# Patient Record
Sex: Male | Born: 1935 | Race: White | Hispanic: No | Marital: Married | Smoking: Former smoker
Health system: Southern US, Community
[De-identification: ages and names within clinical notes are randomized; demographics above are authoritative.]

## PROBLEM LIST (undated history)

## (undated) DIAGNOSIS — M2550 Pain in unspecified joint: Secondary | ICD-10-CM

## (undated) DIAGNOSIS — R350 Frequency of micturition: Secondary | ICD-10-CM

## (undated) DIAGNOSIS — Z5189 Encounter for other specified aftercare: Secondary | ICD-10-CM

## (undated) DIAGNOSIS — I10 Essential (primary) hypertension: Secondary | ICD-10-CM

## (undated) DIAGNOSIS — R3915 Urgency of urination: Secondary | ICD-10-CM

## (undated) DIAGNOSIS — Z860100 Personal history of colon polyps, unspecified: Secondary | ICD-10-CM

## (undated) DIAGNOSIS — M549 Dorsalgia, unspecified: Secondary | ICD-10-CM

## (undated) DIAGNOSIS — R351 Nocturia: Secondary | ICD-10-CM

## (undated) DIAGNOSIS — M1612 Unilateral primary osteoarthritis, left hip: Secondary | ICD-10-CM

## (undated) DIAGNOSIS — Z86718 Personal history of other venous thrombosis and embolism: Secondary | ICD-10-CM

## (undated) DIAGNOSIS — I2510 Atherosclerotic heart disease of native coronary artery without angina pectoris: Secondary | ICD-10-CM

## (undated) DIAGNOSIS — E785 Hyperlipidemia, unspecified: Secondary | ICD-10-CM

## (undated) DIAGNOSIS — M1990 Unspecified osteoarthritis, unspecified site: Secondary | ICD-10-CM

## (undated) DIAGNOSIS — K219 Gastro-esophageal reflux disease without esophagitis: Secondary | ICD-10-CM

## (undated) DIAGNOSIS — G4701 Insomnia due to medical condition: Secondary | ICD-10-CM

## (undated) HISTORY — PX: COLONOSCOPY: SHX174

## (undated) HISTORY — PX: HERNIA REPAIR: SHX51

## (undated) HISTORY — PX: CARDIAC DEFIBRILLATOR PLACEMENT: SHX171

## (undated) HISTORY — PX: VASECTOMY: SHX75

## (undated) HISTORY — PX: CARDIAC CATHETERIZATION: SHX172

## (undated) HISTORY — PX: PACEMAKER INSERTION: SHX728

## (undated) HISTORY — PX: TOTAL KNEE ARTHROPLASTY: SHX125

## (undated) HISTORY — PX: CHOLECYSTECTOMY: SHX55

## (undated) HISTORY — PX: TOTAL HIP ARTHROPLASTY: SHX124

## (undated) HISTORY — PX: OTHER SURGICAL HISTORY: SHX169

---

## 2003-07-25 ENCOUNTER — Emergency Department (HOSPITAL_COMMUNITY): Admission: EM | Admit: 2003-07-25 | Discharge: 2003-07-26 | Payer: Self-pay | Admitting: *Deleted

## 2003-07-27 ENCOUNTER — Ambulatory Visit (HOSPITAL_COMMUNITY): Admission: RE | Admit: 2003-07-27 | Discharge: 2003-07-27 | Payer: Self-pay | Admitting: Family Medicine

## 2006-05-29 DIAGNOSIS — IMO0001 Reserved for inherently not codable concepts without codable children: Secondary | ICD-10-CM

## 2006-05-29 DIAGNOSIS — Z5189 Encounter for other specified aftercare: Secondary | ICD-10-CM

## 2006-05-29 HISTORY — DX: Reserved for inherently not codable concepts without codable children: IMO0001

## 2006-05-29 HISTORY — DX: Encounter for other specified aftercare: Z51.89

## 2007-02-27 HISTORY — PX: CORONARY ARTERY BYPASS GRAFT: SHX141

## 2007-03-02 ENCOUNTER — Emergency Department (HOSPITAL_COMMUNITY): Admission: EM | Admit: 2007-03-02 | Discharge: 2007-03-02 | Payer: Self-pay | Admitting: Emergency Medicine

## 2007-10-19 ENCOUNTER — Emergency Department (HOSPITAL_COMMUNITY): Admission: EM | Admit: 2007-10-19 | Discharge: 2007-10-19 | Payer: Self-pay | Admitting: Emergency Medicine

## 2007-12-02 ENCOUNTER — Emergency Department (HOSPITAL_COMMUNITY): Admission: EM | Admit: 2007-12-02 | Discharge: 2007-12-02 | Payer: Self-pay | Admitting: Emergency Medicine

## 2008-10-14 ENCOUNTER — Inpatient Hospital Stay (HOSPITAL_COMMUNITY): Admission: EM | Admit: 2008-10-14 | Discharge: 2008-10-14 | Payer: Self-pay | Admitting: Emergency Medicine

## 2010-09-06 LAB — POCT CARDIAC MARKERS
CKMB, poc: <1 ng/mL — ABNORMAL LOW (ref 1.0–8.0)
Troponin i, poc: <0.05 ng/mL (ref 0.00–0.09)
Troponin i, poc: <0.05 ng/mL (ref 0.00–0.09)

## 2010-09-06 LAB — URINALYSIS, ROUTINE W REFLEX MICROSCOPIC
Bilirubin Urine: NEGATIVE
Hgb urine dipstick: NEGATIVE
Protein, ur: NEGATIVE mg/dL
Urobilinogen, UA: 0.2 mg/dL (ref 0.0–1.0)

## 2010-09-06 LAB — DIFFERENTIAL
Basophils Relative: 0 % (ref 0–1)
Eosinophils Absolute: 0 10*3/uL (ref 0.0–0.7)
Lymphs Abs: 0.8 10*3/uL (ref 0.7–4.0)
Neutrophils Relative %: 79 % — ABNORMAL HIGH (ref 43–77)

## 2010-09-06 LAB — CBC
HCT: 35.9 % — ABNORMAL LOW (ref 39.0–52.0)
Hemoglobin: 13 g/dL (ref 13.0–17.0)
MCHC: 36.1 g/dL — ABNORMAL HIGH (ref 30.0–36.0)
RBC: 3.94 MIL/uL — ABNORMAL LOW (ref 4.22–5.81)

## 2010-09-06 LAB — URINE CULTURE

## 2010-09-06 LAB — GLUCOSE, CAPILLARY
Glucose-Capillary: 102 mg/dL — ABNORMAL HIGH (ref 70–99)
Glucose-Capillary: 108 mg/dL — ABNORMAL HIGH (ref 70–99)

## 2010-09-06 LAB — COMPREHENSIVE METABOLIC PANEL
ALT: 14 U/L (ref 0–53)
Alkaline Phosphatase: 62 U/L (ref 39–117)
CO2: 29 mEq/L (ref 19–32)
Calcium: 9.5 mg/dL (ref 8.4–10.5)
GFR calc non Af Amer: >60 mL/min (ref >60)
Glucose, Bld: 186 mg/dL — ABNORMAL HIGH (ref 70–99)
Sodium: 136 mEq/L (ref 135–145)

## 2010-09-06 LAB — LIPASE, BLOOD: Lipase: 19 U/L (ref 11–59)

## 2010-09-06 LAB — PROTIME-INR: Prothrombin Time: 13.4 seconds (ref 11.6–15.2)

## 2010-10-11 NOTE — Discharge Summary (Signed)
NAMENASIRE, REALI NO.:  [id number]   MEDICAL RECORD NO.:  [id number]          PATIENT TYPE:  INP   LOCATION:  IC01                          FACILITY:  [facility]   PHYSICIAN:  Dalia Heading, M.D.  DATE OF BIRTH:  07-31-1931   DATE OF ADMISSION:  10/14/2008  DATE OF DISCHARGE:  05/19/2010LH                               DISCHARGE SUMMARY   HOSPITAL COURSE SUMMARY:  The patient is a 77 year old white male who  presented to the emergency room early this morning with right upper  quadrant abdominal pain.  CT scan of the pelvis was performed which  revealed acute cholecystitis.  He was admitted to the hospital for  further evaluation and treatment.  An ultrasound was done which  confirmed thickening gallbladder wall and sludge versus debris within  the gallbladder lumen.  The common bile duct was 5 mm in size.  There  was no evidence of pneumobilia or choledocholithiasis.  The patient has  been asymptomatic since his admission.  His diet was advanced without  difficulty.   The patient was offered a laparoscopic cholecystectomy here at had [facility], but he would prefer to the back to the [facility] where he has  had previous coronary bypass surgery.   The patient is being discharged home on Oct 14, 2008 with good and  improving condition.   DISCHARGE INSTRUCTIONS:  The patient is to follow up Dr. Franky Macho as  needed, he is to follow up with [facility] in [facility] for further  management and treatment of his cholecystitis.   DISCHARGE MEDICATIONS:  1. Ciprofloxacin 500 mg p.o. b.i.d. x1 week.  2. Aspirin 81 mg p.o. daily.  3. Amlodipine 10 mg p.o. daily.  4. Hydrochlorothiazide 25 mg p.o. daily.  5. Losartan 25 mg p.o. daily.  6. Multivitamin 1 tablet p.o. daily.  7. Omeprazole 20 mg p.o. daily.  8. Simvastatin 80 mg p.o. daily.  9. Diclofenac 75 mg p.o. b.i.d.  10.Terazosin 10 mg p.o. daily.  11.Stool softener nightly.  12.Nitrostat p.r.n. chest  pain.   PRINCIPAL DIAGNOSES:  1. Acute cholecystitis.  2. Hypertension.  3. Coronary artery disease.  4. History of non-insulin-dependent diabetes mellitus.   PRINCIPAL PROCEDURE:  None.       Dalia Heading, M.D.  Electronically Signed     MAJ/MEDQ  D:  10/14/2008  T:  10/15/2008  Job:  [id number]   cc:   Catalina Pizza, M.D.  Fax: [phone]

## 2010-10-11 NOTE — H&P (Signed)
NAMEOSSIEL, MARCHIO             ACCOUNT NO.:  [id number]   MEDICAL RECORD NO.:  [id number]          PATIENT TYPE:  INP   LOCATION:  IC01                          FACILITY:  [facility]   PHYSICIAN:  Dalia Heading, M.D.  DATE OF BIRTH:  [birth year]   DATE OF ADMISSION:  10/14/2008  DATE OF DISCHARGE:  05/19/2010LH                              HISTORY & PHYSICAL   CHIEF COMPLAINT:  Right upper quadrant abdominal pain.   HISTORY OF PRESENT ILLNESS:  The patient is a 77 year old white male who  is in his usual state of health where he began experiencing right upper  quadrant abdominal pain.  As it did not relent, he presented to the  emergency room for further evaluation and treatment.  A CT scan of the  abdomen and pelvis was performed which revealed acute cholecystitis with  borderline intrahepatic biliary dilatation versus pneumobilia.  A small  hiatal hernia was also noted.  A right lung base nodule was also noted.  His symptoms have somewhat resolved normal.   PAST MEDICAL HISTORY:  Coronary artery disease, hypertension, BPH.   PAST SURGICAL HISTORY:  A CABG in 2008 at the [facility] in  [place].   CURRENT MEDICATIONS:  1. Aspirin 81 mg p.o. daily.  2. Amlodipine 10 mg p.o. daily.  3. Hydrochlorothiazide 25 mg p.o. daily.  4. Losartan 25 mg p.o. daily.  5. Multivitamin 1 tablet p.o. daily.  6. Omeprazole 20 mg p.o. daily.  7. Simvastatin 80 mg p.o. daily.  8. Diclofenac 75 mg b.i.d.  9. Terazosin 10 mg p.o. daily.  10.Stool softener nightly.  11.Nitrostat 0.4 mg p.r.n.   ALLERGIES:  SULFA.   REVIEW OF SYSTEMS:  The patient is a nonsmoker.  He does drink totally.   PHYSICAL EXAMINATION:  GENERAL:  The patient is a well-developed, well-  nourished white male in no acute distress.  He is afebrile.  VITAL SIGNS:  Stable.  LUNGS:  Clear to auscultation with equal breath sounds bilaterally.  HEART:  Sinus bradycardia without S3, S4, murmurs.  ABDOMEN:  Soft, nontender,  nondistended.  No hepatosplenomegaly or  masses are noted.   LABORATORY FINDINGS:  MET-7 is within normal limits, white blood cell  count is 6.0, hemoglobin 13, hematocrit 35.9, platelet count 137.  Liver  profile was within normal limits.  Lipase is 19.  Urinalysis is  negative.  INR is 1.   IMPRESSION:  Biliary colic secondary to cholelithiasis.   PLAN:  Given the CT findings, an ultrasound of the gallbladder will be  ordered to further delineate the hepatobiliary tree.  He will be started  on Unasyn and pain medication.  Further management is pending of those  results.      Dalia Heading, M.D.  Electronically Signed     MAJ/MEDQ  D:  10/14/2008  T:  10/15/2008  Job:  [id number]   cc:   Catalina Pizza, M.D.  Fax: [phone]

## 2011-02-22 LAB — CBC
HCT: 32 — ABNORMAL LOW
MCHC: 35.6
Platelets: 151
RDW: 14.3

## 2011-02-22 LAB — DIFFERENTIAL
Basophils Absolute: 0
Basophils Relative: 0
Eosinophils Absolute: 0.1
Eosinophils Relative: 2
Lymphocytes Relative: 19

## 2011-02-22 LAB — BASIC METABOLIC PANEL
BUN: 20
GFR calc non Af Amer: >60
Glucose, Bld: 199 — ABNORMAL HIGH
Potassium: 3.6

## 2011-02-22 LAB — POCT CARDIAC MARKERS
Operator id: 106841
Troponin i, poc: <0.05

## 2011-03-09 LAB — POCT I-STAT CREATININE: Operator id: 267321

## 2011-03-09 LAB — POCT CARDIAC MARKERS
CKMB, poc: 1.8
Myoglobin, poc: 62.8
Operator id: 166561

## 2011-03-09 LAB — I-STAT 8, (EC8 V) (CONVERTED LAB)
BUN: 20
Chloride: 99
Glucose, Bld: 216 — ABNORMAL HIGH
pCO2, Ven: 37.5 — ABNORMAL LOW
pH, Ven: 7.374 — ABNORMAL HIGH

## 2011-05-29 ENCOUNTER — Encounter (HOSPITAL_COMMUNITY): Payer: Self-pay | Admitting: Pharmacy Technician

## 2011-06-05 ENCOUNTER — Other Ambulatory Visit: Payer: Self-pay | Admitting: Orthopedic Surgery

## 2011-06-06 ENCOUNTER — Encounter (HOSPITAL_COMMUNITY): Payer: Self-pay

## 2011-06-06 ENCOUNTER — Encounter (HOSPITAL_COMMUNITY)
Admission: RE | Admit: 2011-06-06 | Discharge: 2011-06-06 | Disposition: A | Discharge status: Home / Self Care | Payer: Medicare Other | Source: Ambulatory Visit | Attending: Orthopedic Surgery | Admitting: Orthopedic Surgery

## 2011-06-06 ENCOUNTER — Other Ambulatory Visit: Payer: Self-pay

## 2011-06-06 DIAGNOSIS — M169 Osteoarthritis of hip, unspecified: Secondary | ICD-10-CM | POA: Diagnosis not present

## 2011-06-06 DIAGNOSIS — E871 Hypo-osmolality and hyponatremia: Secondary | ICD-10-CM | POA: Diagnosis not present

## 2011-06-06 DIAGNOSIS — I10 Essential (primary) hypertension: Secondary | ICD-10-CM | POA: Diagnosis not present

## 2011-06-06 DIAGNOSIS — J449 Chronic obstructive pulmonary disease, unspecified: Secondary | ICD-10-CM | POA: Diagnosis not present

## 2011-06-06 DIAGNOSIS — D62 Acute posthemorrhagic anemia: Secondary | ICD-10-CM | POA: Diagnosis not present

## 2011-06-06 DIAGNOSIS — Z01811 Encounter for preprocedural respiratory examination: Secondary | ICD-10-CM | POA: Diagnosis not present

## 2011-06-06 HISTORY — DX: Pain in unspecified joint: M25.50

## 2011-06-06 HISTORY — DX: Personal history of colon polyps, unspecified: Z86.0100

## 2011-06-06 HISTORY — DX: Frequency of micturition: R35.0

## 2011-06-06 HISTORY — DX: Unspecified osteoarthritis, unspecified site: M19.90

## 2011-06-06 HISTORY — DX: Personal history of colonic polyps: Z86.010

## 2011-06-06 HISTORY — DX: Hyperlipidemia, unspecified: E78.5

## 2011-06-06 HISTORY — DX: Encounter for other specified aftercare: Z51.89

## 2011-06-06 HISTORY — DX: Nocturia: R35.1

## 2011-06-06 HISTORY — DX: Urgency of urination: R39.15

## 2011-06-06 HISTORY — DX: Essential (primary) hypertension: I10

## 2011-06-06 HISTORY — DX: Insomnia due to medical condition: G47.01

## 2011-06-06 HISTORY — DX: Personal history of other venous thrombosis and embolism: Z86.718

## 2011-06-06 HISTORY — DX: Atherosclerotic heart disease of native coronary artery without angina pectoris: I25.10

## 2011-06-06 HISTORY — DX: Dorsalgia, unspecified: M54.9

## 2011-06-06 HISTORY — DX: Gastro-esophageal reflux disease without esophagitis: K21.9

## 2011-06-06 LAB — PROTIME-INR
INR: 0.96 (ref 0.00–1.49)
Prothrombin Time: 13 seconds (ref 11.6–15.2)

## 2011-06-06 LAB — URINALYSIS, ROUTINE W REFLEX MICROSCOPIC
Bilirubin Urine: NEGATIVE
Glucose, UA: 500 mg/dL — AB
Ketones, ur: NEGATIVE mg/dL
Leukocytes, UA: NEGATIVE
Nitrite: NEGATIVE
Protein, ur: NEGATIVE mg/dL

## 2011-06-06 LAB — CBC
HCT: 32.9 % — ABNORMAL LOW (ref 39.0–52.0)
Hemoglobin: 11.8 g/dL — ABNORMAL LOW (ref 13.0–17.0)
WBC: 4.8 10*3/uL (ref 4.0–10.5)

## 2011-06-06 LAB — TYPE AND SCREEN: ABO/RH(D): A POS

## 2011-06-06 LAB — BASIC METABOLIC PANEL
Chloride: 95 mEq/L — ABNORMAL LOW (ref 96–112)
GFR calc Af Amer: 90 mL/min — ABNORMAL LOW (ref >90)
Potassium: 4 mEq/L (ref 3.5–5.1)
Sodium: 132 mEq/L — ABNORMAL LOW (ref 135–145)

## 2011-06-06 MED ORDER — CHLORHEXIDINE GLUCONATE 4 % EX LIQD: 60.0000 mL | Freq: Once | CUTANEOUS | Status: DC

## 2011-06-06 NOTE — Pre-Procedure Instructions (Signed)
20 Dustin Vincent  06/06/2011   Your procedure is scheduled on:  Tues,Jan 15 @ 0745  Report to [facility] at 0545 AM.  Call this number if you have problems the morning of surgery: [phone]   Remember:   Do not eat food:After Midnight.  May have clear liquids: up to 4 Hours before arrival.(until 1:45am)  Clear liquids include soda, tea, black coffee, apple or grape juice, broth.  Take these medicines the morning of surgery with A SIP OF WATER: Gabapentin,Pain Pill(if needed),Isosorbide,Metoprolol,Omeprazole   Do not wear jewelry, make-up or nail polish.  Do not wear lotions, powders, or perfumes. You may wear deodorant.  Do not shave 48 hours prior to surgery.  Do not bring valuables to the hospital.  Contacts, dentures or bridgework may not be worn into surgery.  Leave suitcase in the car. After surgery it may be brought to your room.  For patients admitted to the hospital, checkout time is 11:00 AM the day of discharge.   Patients discharged the day of surgery will not be allowed to drive home.  Name and phone number of your driver:   Special Instructions: CHG Shower Use Special Wash: 1/2 bottle night before surgery and 1/2 bottle morning of surgery.   Please read over the following fact sheets that you were given: Pain Booklet, Coughing and Deep Breathing, Blood Transfusion Information, Total Joint Packet, MRSA Information and Surgical Site Infection Prevention

## 2011-06-06 NOTE — Progress Notes (Signed)
Sees different cardiologists at [facility] in [place]--last visit just recent-to request records  Stress test done couple of years ago-to request records Echo done couple of years ago-to request records  Requested cardiac clearance from [facility]

## 2011-06-07 NOTE — Consult Note (Signed)
Anesthesia:  Patient is a 78 year old male scheduled for a left THA on 06/13/11.  His history includes HLD, HTN, CAD, CABG 2008, GERD, anemia, DM2, former smoker, trifasicular block, and history of "blood clots".  His is followed in the Cardiology Clinic at the [facility] in [place].  He was seen by Dr. Adella Hare on 05/03/11 for cardiac clearance.  His last cath there was on 07/27/10 with final diagnosis listed as 3V CAD, 2/3 grafts patent, 100% distal RCA CTO, occluded SVG to the distal native RCA, and failed attempt to balloon CTO of distal RCA.  Her note also mentions an echo done in May of 2011 showing preserved LVSF, mild LVH, grade 1 diastolic dysfunction.  She also mentions a recent exercise stress test with achievement of 7.6 METS.  I have requested these records from the [place].   EKG shows SB with first degree AVB, right BBB, LAFB, bifascicular block, LVH with QRS widening.  Probably nonspecific T wave changes.   CXR shows COPD without acute cardiopulmonary disease.    Labs acceptable form an Anesthesia standpoint.  He will get a CBG when he arrives for surgery.  His H/H is 11.8/32.9.  A T&S has already been done.  Since he has received Cardiac clearance, I anticipate that he can proceed as scheduled if he remains asymptomatic.

## 2011-06-12 MED ORDER — CEFAZOLIN SODIUM-DEXTROSE 2-3 GM-% IV SOLR
2.0000 g | INTRAVENOUS | Status: AC
  Administered 2011-06-13: 2 g via INTRAVENOUS
  Filled 2011-06-12: qty 50

## 2011-06-13 ENCOUNTER — Encounter (HOSPITAL_COMMUNITY): Payer: Self-pay | Admitting: *Deleted

## 2011-06-13 ENCOUNTER — Ambulatory Visit (HOSPITAL_COMMUNITY): Payer: Medicare Other

## 2011-06-13 ENCOUNTER — Encounter (HOSPITAL_COMMUNITY): Payer: Self-pay | Admitting: Orthopedic Surgery

## 2011-06-13 ENCOUNTER — Encounter (HOSPITAL_COMMUNITY)
Admission: RE | Disposition: A | Discharge status: Skilled Nursing Facility | Payer: Self-pay | Source: Ambulatory Visit | Attending: Orthopedic Surgery

## 2011-06-13 ENCOUNTER — Encounter (HOSPITAL_COMMUNITY): Payer: Self-pay | Admitting: Vascular Surgery

## 2011-06-13 ENCOUNTER — Ambulatory Visit (HOSPITAL_COMMUNITY): Payer: Medicare Other | Admitting: Vascular Surgery

## 2011-06-13 ENCOUNTER — Inpatient Hospital Stay (HOSPITAL_COMMUNITY)
Admission: RE | Admit: 2011-06-13 | Discharge: 2011-06-16 | DRG: 470 | Disposition: A | Discharge status: Skilled Nursing Facility | Payer: Medicare Other | Source: Ambulatory Visit | Attending: Orthopedic Surgery | Admitting: Orthopedic Surgery

## 2011-06-13 DIAGNOSIS — M169 Osteoarthritis of hip, unspecified: Secondary | ICD-10-CM | POA: Diagnosis not present

## 2011-06-13 DIAGNOSIS — Z951 Presence of aortocoronary bypass graft: Secondary | ICD-10-CM

## 2011-06-13 DIAGNOSIS — I10 Essential (primary) hypertension: Secondary | ICD-10-CM | POA: Diagnosis not present

## 2011-06-13 DIAGNOSIS — Z86718 Personal history of other venous thrombosis and embolism: Secondary | ICD-10-CM

## 2011-06-13 DIAGNOSIS — I2510 Atherosclerotic heart disease of native coronary artery without angina pectoris: Secondary | ICD-10-CM | POA: Diagnosis present

## 2011-06-13 DIAGNOSIS — Z860100 Personal history of colon polyps, unspecified: Secondary | ICD-10-CM

## 2011-06-13 DIAGNOSIS — Z01812 Encounter for preprocedural laboratory examination: Secondary | ICD-10-CM | POA: Diagnosis not present

## 2011-06-13 DIAGNOSIS — E785 Hyperlipidemia, unspecified: Secondary | ICD-10-CM | POA: Diagnosis present

## 2011-06-13 DIAGNOSIS — M549 Dorsalgia, unspecified: Secondary | ICD-10-CM | POA: Diagnosis present

## 2011-06-13 DIAGNOSIS — K5900 Constipation, unspecified: Secondary | ICD-10-CM | POA: Diagnosis present

## 2011-06-13 DIAGNOSIS — R509 Fever, unspecified: Secondary | ICD-10-CM | POA: Diagnosis not present

## 2011-06-13 DIAGNOSIS — Z96649 Presence of unspecified artificial hip joint: Secondary | ICD-10-CM

## 2011-06-13 DIAGNOSIS — Z471 Aftercare following joint replacement surgery: Secondary | ICD-10-CM | POA: Diagnosis not present

## 2011-06-13 DIAGNOSIS — E119 Type 2 diabetes mellitus without complications: Secondary | ICD-10-CM | POA: Diagnosis present

## 2011-06-13 DIAGNOSIS — K219 Gastro-esophageal reflux disease without esophagitis: Secondary | ICD-10-CM | POA: Diagnosis present

## 2011-06-13 DIAGNOSIS — E860 Dehydration: Secondary | ICD-10-CM | POA: Diagnosis not present

## 2011-06-13 DIAGNOSIS — M1612 Unilateral primary osteoarthritis, left hip: Secondary | ICD-10-CM

## 2011-06-13 DIAGNOSIS — Z882 Allergy status to sulfonamides status: Secondary | ICD-10-CM | POA: Diagnosis not present

## 2011-06-13 DIAGNOSIS — M25559 Pain in unspecified hip: Secondary | ICD-10-CM | POA: Diagnosis not present

## 2011-06-13 DIAGNOSIS — J449 Chronic obstructive pulmonary disease, unspecified: Secondary | ICD-10-CM | POA: Diagnosis not present

## 2011-06-13 DIAGNOSIS — Z87891 Personal history of nicotine dependence: Secondary | ICD-10-CM

## 2011-06-13 DIAGNOSIS — E871 Hypo-osmolality and hyponatremia: Secondary | ICD-10-CM | POA: Diagnosis not present

## 2011-06-13 DIAGNOSIS — D62 Acute posthemorrhagic anemia: Secondary | ICD-10-CM | POA: Diagnosis not present

## 2011-06-13 DIAGNOSIS — M1610 Unilateral primary osteoarthritis, unspecified hip: Principal | ICD-10-CM | POA: Diagnosis present

## 2011-06-13 DIAGNOSIS — Z96659 Presence of unspecified artificial knee joint: Secondary | ICD-10-CM

## 2011-06-13 DIAGNOSIS — M1710 Unilateral primary osteoarthritis, unspecified knee: Secondary | ICD-10-CM | POA: Diagnosis not present

## 2011-06-13 DIAGNOSIS — I517 Cardiomegaly: Secondary | ICD-10-CM | POA: Diagnosis not present

## 2011-06-13 DIAGNOSIS — M1990 Unspecified osteoarthritis, unspecified site: Secondary | ICD-10-CM | POA: Diagnosis not present

## 2011-06-13 HISTORY — DX: Unilateral primary osteoarthritis, left hip: M16.12

## 2011-06-13 HISTORY — PX: TOTAL HIP ARTHROPLASTY: SHX124

## 2011-06-13 LAB — GLUCOSE, CAPILLARY: Glucose-Capillary: 169 mg/dL — ABNORMAL HIGH (ref 70–99)

## 2011-06-13 SURGERY — ARTHROPLASTY, HIP, TOTAL,POSTERIOR APPROACH
Anesthesia: General | Site: Hip | Laterality: Left | Wound class: Clean

## 2011-06-13 MED ORDER — METHOCARBAMOL 500 MG PO TABS: 500.0000 mg | ORAL_TABLET | Freq: Four times a day (QID) | ORAL | Status: DC

## 2011-06-13 MED ORDER — GLYCOPYRROLATE 0.2 MG/ML IJ SOLN
INTRAMUSCULAR | Status: DC | PRN
  Administered 2011-06-13: .4 mg via INTRAVENOUS

## 2011-06-13 MED ORDER — GABAPENTIN 100 MG PO CAPS
200.0000 mg | ORAL_CAPSULE | Freq: Every day | ORAL | Status: DC
  Administered 2011-06-14: 200 mg via ORAL
  Filled 2011-06-13 (×3): qty 2

## 2011-06-13 MED ORDER — ISOSORBIDE MONONITRATE ER 30 MG PO TB24
30.0000 mg | ORAL_TABLET | Freq: Every day | ORAL | Status: DC
  Administered 2011-06-14 – 2011-06-16 (×2): 30 mg via ORAL
  Filled 2011-06-13 (×5): qty 1

## 2011-06-13 MED ORDER — METOPROLOL TARTRATE 25 MG PO TABS
25.0000 mg | ORAL_TABLET | Freq: Two times a day (BID) | ORAL | Status: DC
  Administered 2011-06-14 – 2011-06-16 (×5): 25 mg via ORAL
  Filled 2011-06-13 (×7): qty 1

## 2011-06-13 MED ORDER — WARFARIN VIDEO
Freq: Once | Status: AC
  Administered 2011-06-14: 12:00:00

## 2011-06-13 MED ORDER — ZOLPIDEM TARTRATE 5 MG PO TABS: 5.0000 mg | ORAL_TABLET | Freq: Every evening | ORAL | Status: DC | PRN

## 2011-06-13 MED ORDER — LIDOCAINE HCL (PF) 1 % IJ SOLN
INTRAMUSCULAR | Status: DC | PRN
  Administered 2011-06-13: 3 mL via INTRADERMAL

## 2011-06-13 MED ORDER — ENOXAPARIN SODIUM 40 MG/0.4ML ~~LOC~~ SOLN
40.0000 mg | Freq: Every day | SUBCUTANEOUS | Status: DC
  Administered 2011-06-13 – 2011-06-15 (×3): 40 mg via SUBCUTANEOUS
  Filled 2011-06-13 (×4): qty 0.4

## 2011-06-13 MED ORDER — METOCLOPRAMIDE HCL 10 MG PO TABS: 5.0000 mg | ORAL_TABLET | Freq: Three times a day (TID) | ORAL | Status: DC | PRN

## 2011-06-13 MED ORDER — SODIUM CHLORIDE 0.9 % IR SOLN
Status: DC | PRN
  Administered 2011-06-13: 1000 mL

## 2011-06-13 MED ORDER — ENOXAPARIN SODIUM 40 MG/0.4ML ~~LOC~~ SOLN: 40.0000 mg | SUBCUTANEOUS | Status: DC

## 2011-06-13 MED ORDER — METOCLOPRAMIDE HCL 5 MG/ML IJ SOLN
5.0000 mg | Freq: Three times a day (TID) | INTRAMUSCULAR | Status: DC | PRN
  Filled 2011-06-13: qty 2

## 2011-06-13 MED ORDER — METHOCARBAMOL 100 MG/ML IJ SOLN
500.0000 mg | INTRAVENOUS | Status: DC
  Filled 2011-06-13: qty 5

## 2011-06-13 MED ORDER — ACETAMINOPHEN 325 MG PO TABS
650.0000 mg | ORAL_TABLET | Freq: Four times a day (QID) | ORAL | Status: DC | PRN
  Administered 2011-06-15: 650 mg via ORAL
  Filled 2011-06-13: qty 2

## 2011-06-13 MED ORDER — MIDAZOLAM HCL 5 MG/5ML IJ SOLN
INTRAMUSCULAR | Status: DC | PRN
  Administered 2011-06-13: 1 mg via INTRAVENOUS

## 2011-06-13 MED ORDER — EPHEDRINE SULFATE 50 MG/ML IJ SOLN
INTRAMUSCULAR | Status: DC | PRN
  Administered 2011-06-13 (×2): 5 mg via INTRAVENOUS

## 2011-06-13 MED ORDER — NEOSTIGMINE METHYLSULFATE 1 MG/ML IJ SOLN
INTRAMUSCULAR | Status: DC | PRN
  Administered 2011-06-13: 3 mg via INTRAVENOUS

## 2011-06-13 MED ORDER — SIMVASTATIN 20 MG PO TABS
20.0000 mg | ORAL_TABLET | Freq: Every day | ORAL | Status: DC
  Administered 2011-06-13 – 2011-06-15 (×3): 20 mg via ORAL
  Filled 2011-06-13 (×4): qty 1

## 2011-06-13 MED ORDER — METFORMIN HCL 500 MG PO TABS
1000.0000 mg | ORAL_TABLET | Freq: Two times a day (BID) | ORAL | Status: DC
  Administered 2011-06-14 – 2011-06-16 (×5): 1000 mg via ORAL
  Filled 2011-06-13 (×7): qty 2

## 2011-06-13 MED ORDER — PROMETHAZINE HCL 25 MG/ML IJ SOLN: 6.2500 mg | INTRAMUSCULAR | Status: DC | PRN

## 2011-06-13 MED ORDER — ONDANSETRON HCL 4 MG PO TABS: 4.0000 mg | ORAL_TABLET | Freq: Four times a day (QID) | ORAL | Status: DC | PRN

## 2011-06-13 MED ORDER — SCOPOLAMINE 1 MG/3DAYS TD PT72
MEDICATED_PATCH | TRANSDERMAL | Status: DC | PRN
  Administered 2011-06-13: 1 via TRANSDERMAL

## 2011-06-13 MED ORDER — WARFARIN SODIUM 7.5 MG PO TABS
7.5000 mg | ORAL_TABLET | Freq: Once | ORAL | Status: AC
  Administered 2011-06-13: 7.5 mg via ORAL
  Filled 2011-06-13: qty 1

## 2011-06-13 MED ORDER — OXYCODONE-ACETAMINOPHEN 10-325 MG PO TABS: 1.0000 | ORAL_TABLET | Freq: Four times a day (QID) | ORAL | Status: AC | PRN

## 2011-06-13 MED ORDER — ACETAMINOPHEN 650 MG RE SUPP: 650.0000 mg | Freq: Four times a day (QID) | RECTAL | Status: DC | PRN

## 2011-06-13 MED ORDER — FENTANYL CITRATE 0.05 MG/ML IJ SOLN
INTRAMUSCULAR | Status: DC | PRN
  Administered 2011-06-13 (×3): 50 ug via INTRAVENOUS
  Administered 2011-06-13: 100 ug via INTRAVENOUS
  Administered 2011-06-13 (×3): 50 ug via INTRAVENOUS

## 2011-06-13 MED ORDER — PHENOL 1.4 % MT LIQD
1.0000 | OROMUCOSAL | Status: DC | PRN
  Filled 2011-06-13: qty 177

## 2011-06-13 MED ORDER — CEFAZOLIN SODIUM 1-5 GM-% IV SOLN
1.0000 g | Freq: Four times a day (QID) | INTRAVENOUS | Status: AC
  Administered 2011-06-13 – 2011-06-14 (×3): 1 g via INTRAVENOUS
  Filled 2011-06-13 (×3): qty 50

## 2011-06-13 MED ORDER — METHYLPREDNISOLONE ACETATE PF 40 MG/ML IJ SUSP
INTRAMUSCULAR | Status: DC | PRN
  Administered 2011-06-13: 40 mg

## 2011-06-13 MED ORDER — ONDANSETRON HCL 4 MG/2ML IJ SOLN
INTRAMUSCULAR | Status: DC | PRN
  Administered 2011-06-13: 4 mg via INTRAVENOUS

## 2011-06-13 MED ORDER — MEPERIDINE HCL 25 MG/ML IJ SOLN: 6.2500 mg | INTRAMUSCULAR | Status: DC | PRN

## 2011-06-13 MED ORDER — POTASSIUM CHLORIDE IN NACL 20-0.45 MEQ/L-% IV SOLN
INTRAVENOUS | Status: DC
  Administered 2011-06-13: 16:00:00 via INTRAVENOUS
  Filled 2011-06-13 (×7): qty 1000

## 2011-06-13 MED ORDER — DIPHENHYDRAMINE HCL 12.5 MG/5ML PO ELIX
12.5000 mg | ORAL_SOLUTION | ORAL | Status: DC | PRN
  Filled 2011-06-13: qty 10

## 2011-06-13 MED ORDER — PROPOFOL 10 MG/ML IV EMUL
INTRAVENOUS | Status: DC | PRN
  Administered 2011-06-13: 100 mg via INTRAVENOUS

## 2011-06-13 MED ORDER — PANTOPRAZOLE SODIUM 40 MG PO TBEC
40.0000 mg | DELAYED_RELEASE_TABLET | Freq: Every day | ORAL | Status: DC
  Administered 2011-06-14 – 2011-06-16 (×3): 40 mg via ORAL
  Filled 2011-06-13 (×4): qty 1

## 2011-06-13 MED ORDER — OXYCODONE HCL 5 MG PO TABS
5.0000 mg | ORAL_TABLET | ORAL | Status: DC | PRN
  Administered 2011-06-14 – 2011-06-16 (×5): 10 mg via ORAL
  Filled 2011-06-13 (×5): qty 2

## 2011-06-13 MED ORDER — TERAZOSIN HCL 5 MG PO CAPS
10.0000 mg | ORAL_CAPSULE | Freq: Every day | ORAL | Status: DC
  Administered 2011-06-13 – 2011-06-14 (×2): 10 mg via ORAL
  Filled 2011-06-13 (×7): qty 2

## 2011-06-13 MED ORDER — OXYCODONE-ACETAMINOPHEN 5-325 MG PO TABS
1.0000 | ORAL_TABLET | ORAL | Status: DC | PRN
  Administered 2011-06-13 – 2011-06-16 (×6): 2 via ORAL
  Administered 2011-06-16 (×2): 1 via ORAL
  Filled 2011-06-13 (×2): qty 2
  Filled 2011-06-13: qty 1
  Filled 2011-06-13 (×5): qty 2
  Filled 2011-06-13: qty 1

## 2011-06-13 MED ORDER — METHOCARBAMOL 500 MG PO TABS
500.0000 mg | ORAL_TABLET | Freq: Four times a day (QID) | ORAL | Status: DC | PRN
  Administered 2011-06-13 – 2011-06-16 (×6): 500 mg via ORAL
  Filled 2011-06-13 (×6): qty 1

## 2011-06-13 MED ORDER — COUMADIN BOOK
Freq: Once | Status: AC
  Administered 2011-06-13: 18:00:00
  Filled 2011-06-13: qty 1

## 2011-06-13 MED ORDER — LACTATED RINGERS IV SOLN
INTRAVENOUS | Status: DC | PRN
  Administered 2011-06-13 (×3): via INTRAVENOUS

## 2011-06-13 MED ORDER — LOSARTAN POTASSIUM 50 MG PO TABS
100.0000 mg | ORAL_TABLET | Freq: Every day | ORAL | Status: DC
  Administered 2011-06-13 – 2011-06-16 (×3): 100 mg via ORAL
  Filled 2011-06-13 (×6): qty 2

## 2011-06-13 MED ORDER — HYDROCHLOROTHIAZIDE 25 MG PO TABS
25.0000 mg | ORAL_TABLET | Freq: Every day | ORAL | Status: DC
  Administered 2011-06-13 – 2011-06-16 (×3): 25 mg via ORAL
  Filled 2011-06-13 (×5): qty 1

## 2011-06-13 MED ORDER — METHOCARBAMOL 100 MG/ML IJ SOLN
500.0000 mg | Freq: Four times a day (QID) | INTRAVENOUS | Status: DC | PRN
  Administered 2011-06-13: 500 mg via INTRAVENOUS
  Filled 2011-06-13: qty 5

## 2011-06-13 MED ORDER — HYDROMORPHONE HCL PF 1 MG/ML IJ SOLN
0.2500 mg | INTRAMUSCULAR | Status: DC | PRN
  Administered 2011-06-13 (×2): 0.5 mg via INTRAVENOUS

## 2011-06-13 MED ORDER — ONDANSETRON HCL 4 MG/2ML IJ SOLN
4.0000 mg | Freq: Four times a day (QID) | INTRAMUSCULAR | Status: DC | PRN
  Administered 2011-06-16: 4 mg via INTRAVENOUS
  Filled 2011-06-13: qty 2

## 2011-06-13 MED ORDER — ACETAMINOPHEN 10 MG/ML IV SOLN
INTRAVENOUS | Status: AC
  Filled 2011-06-13: qty 100

## 2011-06-13 MED ORDER — HYDROMORPHONE HCL PF 1 MG/ML IJ SOLN
0.5000 mg | INTRAMUSCULAR | Status: DC | PRN
  Administered 2011-06-13 – 2011-06-16 (×7): 1 mg via INTRAVENOUS
  Filled 2011-06-13 (×6): qty 1

## 2011-06-13 MED ORDER — BUPIVACAINE HCL (PF) 0.25 % IJ SOLN
INTRAMUSCULAR | Status: DC | PRN
  Administered 2011-06-13: 20 mL

## 2011-06-13 MED ORDER — MENTHOL 3 MG MT LOZG: 1.0000 | LOZENGE | OROMUCOSAL | Status: DC | PRN

## 2011-06-13 MED ORDER — ROCURONIUM BROMIDE 100 MG/10ML IV SOLN
INTRAVENOUS | Status: DC | PRN
  Administered 2011-06-13: 50 mg via INTRAVENOUS

## 2011-06-13 MED ORDER — HYDROMORPHONE HCL PF 1 MG/ML IJ SOLN
INTRAMUSCULAR | Status: AC
  Filled 2011-06-13: qty 1

## 2011-06-13 MED ORDER — WARFARIN SODIUM 5 MG PO TABS: 5.0000 mg | ORAL_TABLET | Freq: Every day | ORAL | Status: DC

## 2011-06-13 MED ORDER — ACETAMINOPHEN 10 MG/ML IV SOLN
INTRAVENOUS | Status: DC | PRN
  Administered 2011-06-13: 1000 mg via INTRAVENOUS

## 2011-06-13 SURGICAL SUPPLY — 66 items
BENZOIN TINCTURE PRP APPL 2/3 (GAUZE/BANDAGES/DRESSINGS) ×2 IMPLANT
BLADE SAW SAG 73X25 THK (BLADE) ×1
BLADE SAW SGTL 73X25 THK (BLADE) ×1 IMPLANT
BLADE SURG 10 STRL SS (BLADE) ×2 IMPLANT
BRUSH FEMORAL CANAL (MISCELLANEOUS) IMPLANT
CLOTH BEACON ORANGE TIMEOUT ST (SAFETY) ×2 IMPLANT
COVER BACK TABLE 24X17X13 BIG (DRAPES) ×2 IMPLANT
COVER SURGICAL LIGHT HANDLE (MISCELLANEOUS) ×4 IMPLANT
DRAPE INCISE IOBAN 66X45 STRL (DRAPES) ×2 IMPLANT
DRAPE ORTHO SPLIT 77X108 STRL (DRAPES) ×2
DRAPE PROXIMA HALF (DRAPES) ×2 IMPLANT
DRAPE SURG ORHT 6 SPLT 77X108 (DRAPES) ×2 IMPLANT
DRAPE U-SHAPE 47X51 STRL (DRAPES) ×2 IMPLANT
DRILL BIT 5/64 (BIT) ×2 IMPLANT
DRSG MEPILEX BORDER 4X12 (GAUZE/BANDAGES/DRESSINGS) ×2 IMPLANT
DRSG MEPILEX BORDER 4X8 (GAUZE/BANDAGES/DRESSINGS) IMPLANT
DRSG PAD ABDOMINAL 8X10 ST (GAUZE/BANDAGES/DRESSINGS) ×4 IMPLANT
DURAPREP 26ML APPLICATOR (WOUND CARE) ×2 IMPLANT
ELECT CAUTERY BLADE 6.4 (BLADE) ×4 IMPLANT
ELECT REM PT RETURN 9FT ADLT (ELECTROSURGICAL) ×2
ELECTRODE REM PT RTRN 9FT ADLT (ELECTROSURGICAL) ×1 IMPLANT
EVACUATOR 1/8 PVC DRAIN (DRAIN) IMPLANT
GLOVE BIOGEL PI IND STRL 8 (GLOVE) ×1 IMPLANT
GLOVE BIOGEL PI INDICATOR 8 (GLOVE) ×1
GLOVE ECLIPSE 7.0 STRL STRAW (GLOVE) ×2 IMPLANT
GLOVE ORTHO TXT STRL SZ7.5 (GLOVE) ×4 IMPLANT
GLOVE SURG ORTHO 8.0 STRL STRW (GLOVE) ×2 IMPLANT
GOWN STRL NON-REIN LRG LVL3 (GOWN DISPOSABLE) ×6 IMPLANT
HANDPIECE INTERPULSE COAX TIP (DISPOSABLE)
HOOD PEEL AWAY FACE SHEILD DIS (HOOD) ×4 IMPLANT
KIT BASIN OR (CUSTOM PROCEDURE TRAY) ×2 IMPLANT
KIT ROOM TURNOVER OR (KITS) ×2 IMPLANT
MANIFOLD NEPTUNE II (INSTRUMENTS) ×2 IMPLANT
NEEDLE 18GX1X1/2 (RX/OR ONLY) (NEEDLE) ×2 IMPLANT
NEEDLE HYPO 25GX1X1/2 BEV (NEEDLE) ×6 IMPLANT
NS IRRIG 1000ML POUR BTL (IV SOLUTION) ×2 IMPLANT
PACK TOTAL JOINT (CUSTOM PROCEDURE TRAY) ×2 IMPLANT
PAD ARMBOARD 7.5X6 YLW CONV (MISCELLANEOUS) ×4 IMPLANT
PENCIL BUTTON HOLSTER BLD 10FT (ELECTRODE) ×2 IMPLANT
PILLOW ABDUCTION HIP (SOFTGOODS) ×2 IMPLANT
PRESSURIZER FEMORAL UNIV (MISCELLANEOUS) IMPLANT
RETRIEVER SUT HEWSON (MISCELLANEOUS) ×2 IMPLANT
SET HNDPC FAN SPRY TIP SCT (DISPOSABLE) IMPLANT
SPONGE GAUZE 4X4 12PLY (GAUZE/BANDAGES/DRESSINGS) IMPLANT
SPONGE LAP 4X18 X RAY DECT (DISPOSABLE) IMPLANT
STRIP CLOSURE SKIN 1/2X4 (GAUZE/BANDAGES/DRESSINGS) IMPLANT
STRIP CLOSURE SKIN 1X5 (GAUZE/BANDAGES/DRESSINGS) ×2 IMPLANT
SUCTION FRAZIER TIP 10 FR DISP (SUCTIONS) ×2 IMPLANT
SUT FIBERWIRE #2 38 REV NDL BL (SUTURE) ×6
SUT FIBERWIRE #2 38 T-5 BLUE (SUTURE)
SUT MNCRL AB 3-0 PS2 18 (SUTURE) ×2 IMPLANT
SUT VIC AB 0 CT1 27 (SUTURE) ×1
SUT VIC AB 0 CT1 27XBRD ANBCTR (SUTURE) ×1 IMPLANT
SUT VIC AB 1 CT1 27 (SUTURE) ×1
SUT VIC AB 1 CT1 27XBRD ANBCTR (SUTURE) ×1 IMPLANT
SUT VIC AB 2-0 CT1 27 (SUTURE) ×1
SUT VIC AB 2-0 CT1 TAPERPNT 27 (SUTURE) ×1 IMPLANT
SUT VIC AB 3-0 SH 8-18 (SUTURE) ×2 IMPLANT
SUTURE FIBERWR #2 38 T-5 BLUE (SUTURE) IMPLANT
SUTURE FIBERWR#2 38 REV NDL BL (SUTURE) ×3 IMPLANT
SYR CONTROL 10ML LL (SYRINGE) ×6 IMPLANT
TOWEL OR 17X24 6PK STRL BLUE (TOWEL DISPOSABLE) ×2 IMPLANT
TOWEL OR 17X26 10 PK STRL BLUE (TOWEL DISPOSABLE) ×2 IMPLANT
TOWER CARTRIDGE SMART MIX (DISPOSABLE) IMPLANT
TRAY FOLEY CATH 14FR (SET/KITS/TRAYS/PACK) ×2 IMPLANT
WATER STERILE IRR 1000ML POUR (IV SOLUTION) ×6 IMPLANT

## 2011-06-13 NOTE — Transfer of Care (Signed)
Immediate Anesthesia Transfer of Care Note  Patient: Dustin Vincent  Procedure(s) Performed:  TOTAL HIP ARTHROPLASTY - 2 HOURS NEEDED FOR THIS CASE/ Left Knee Injection PER Kathy  Patient Location: PACU  Anesthesia Type: General  Level of Consciousness: awake, alert  and oriented  Airway & Oxygen Therapy: Patient Spontanous Breathing and Patient connected to nasal cannula oxygen  Post-op Assessment: Report given to PACU RN, Post -op Vital signs reviewed and stable and Patient moving all extremities X 4  Post vital signs: Reviewed and stable Filed Vitals:   06/13/11 0624  BP: 133/73  Pulse: 59  Temp: 36.8 C  Resp: 18    Complications: No apparent anesthesia complications

## 2011-06-13 NOTE — Progress Notes (Signed)
ANTICOAGULATION CONSULT NOTE - Initial Consult  Pharmacy Consult for Coumadin Indication: VTE prophylaxis  Allergies  Allergen Reactions  . Sulfa Antibiotics Rash    Was a salve    Patient Measurements: Height: 5\' 11"  (180.3 cm) Weight: 189 lb (85.73 kg) (from medical records 05/04/11) IBW/kg (Calculated) : 75.3    Vital Signs: Temp: 97.3 F (36.3 C) (01/15 1305) Temp src: Oral (01/15 1305) BP: 133/68 mmHg (01/15 1305) Pulse Rate: 57  (01/15 1305)  Labs: Preop labs 06/06/11 WBC 4.8, H/H 11.8/32.9, PLTC 153, PT 13, INR 0.96,  No results found for this basename: HGB:2,HCT:3,PLT:3,APTT:3,LABPROT:3,INR:3,HEPARINUNFRC:3,CREATININE:3,CKTOTAL:3,CKMB:3,TROPONINI:3 in the last 72 hours Estimated Creatinine Clearance: 67.9 ml/min (by C-G formula based on Cr of 0.97).  Medical History: Past Medical History  Diagnosis Date  . Hyperlipidemia     takes Zocor daily  . Hypertension     Losartan,Isosorbide,and Metoprolol daily  . Coronary artery disease   . History of blood clots     pt states a filter was placed  . Arthritis   . Joint pain   . Back pain     3 buldging disc  . Constipation     takes a stool softener daily  . History of colon polyps   . Urinary frequency   . Urinary urgency   . Nocturia   . GERD (gastroesophageal reflux disease)     takes Omeprazole daily  . Blood transfusion 2008  . Diabetes mellitus     takes Metformin 1000mg  bid  . Insomnia due to medical condition   . Primary osteoarthritis of left hip 06/13/2011  s/p 3vCABG in 2008, Diastolic Heart failure , TTE 09/2009 preserved LVSF  Medications:  Prescriptions prior to admission  Medication Sig Dispense Refill  . hydrochlorothiazide (HYDRODIURIL) 25 MG tablet Take 25 mg by mouth daily.        . isosorbide mononitrate (IMDUR) 30 MG 24 hr tablet Take 30 mg by mouth daily.        Marland Kitchen losartan (COZAAR) 100 MG tablet Take 100 mg by mouth daily.        . metFORMIN (GLUCOPHAGE) 500 MG tablet Take 1,000 mg by  mouth 2 (two) times daily with a meal.        . metoprolol tartrate (LOPRESSOR) 25 MG tablet Take 25 mg by mouth 2 (two) times daily.        Marland Kitchen omeprazole (PRILOSEC) 20 MG capsule Take 20 mg by mouth 2 (two) times daily.        . simvastatin (ZOCOR) 40 MG tablet Take 20 mg by mouth at bedtime.        Marland Kitchen terazosin (HYTRIN) 10 MG capsule Take 10 mg by mouth at bedtime.        Marland Kitchen DISCONTD: HYDROcodone-acetaminophen (VICODIN) 5-500 MG per tablet Take 1 tablet by mouth every 6 (six) hours as needed. For pain       . DISCONTD: meloxicam (MOBIC) 15 MG tablet Take 15 mg by mouth daily.        Marland Kitchen gabapentin (NEURONTIN) 100 MG capsule Take 200 mg by mouth daily.         Scheduled:    . ceFAZolin (ANCEF) IV  1 g Intravenous Q6H  . ceFAZolin (ANCEF) IV  2 g Intravenous 60 min Pre-Op  . enoxaparin  40 mg Subcutaneous QHS  . gabapentin  200 mg Oral Daily  . hydrochlorothiazide  25 mg Oral Daily  . HYDROmorphone      . isosorbide mononitrate  30 mg Oral  Daily  . losartan  100 mg Oral Daily  . metFORMIN  1,000 mg Oral BID WC  . metoprolol tartrate  25 mg Oral BID  . pantoprazole  40 mg Oral Q1200  . simvastatin  20 mg Oral QHS  . terazosin  10 mg Oral QHS  . DISCONTD: methocarbamol(ROBAXIN) IV  500 mg Intravenous To PACU    Assessment: 78 yo WM s/p left THA due to osteoarthritis. PMH indicates h/o blood clot and had filter placed. Patient's memory of this is not clear. No h/o recent bleeding complications. Lovenox 40mg  sq q24hr bridge until INR =or >1.8.   Goal of Therapy:  INR 2-3   Plan:  Coumadin 7.5mg  po tonight.  Lovenox to be discontinued when INR =/> 1.8 Monitor daily INR.   Arman Filter 06/13/2011,3:47 PM

## 2011-06-13 NOTE — Anesthesia Postprocedure Evaluation (Signed)
  Anesthesia Post-op Note  Patient: Dustin Vincent  Procedure(s) Performed:  TOTAL HIP ARTHROPLASTY - 2 HOURS NEEDED FOR THIS CASE/ Left Knee Injection PER Kathy  Patient Location: PACU  Anesthesia Type: General  Level of Consciousness: awake and alert   Airway and Oxygen Therapy: Patient Spontanous Breathing and Patient connected to nasal cannula oxygen  Post-op Pain: mild  Post-op Assessment: Post-op Vital signs reviewed, Patient's Cardiovascular Status Stable, Respiratory Function Stable, Patent Airway and No signs of Nausea or vomiting  Post-op Vital Signs: Reviewed and stable  Complications: No apparent anesthesia complications

## 2011-06-13 NOTE — Op Note (Signed)
06/13/2011  9:44 AM  PATIENT:  Dustin Vincent   MRN: [id number]  PRE-OPERATIVE DIAGNOSIS:  DJD LEFT HIP/osteoarthritis  POST-OPERATIVE DIAGNOSIS:  DJD LEFT HIP/osteoarthritis  PROCEDURE:  Procedure(s): TOTAL HIP ARTHROPLASTY  PREOPERATIVE INDICATIONS:    Dustin Vincent is an 78 y.o. male who has a diagnosis of Primary osteoarthritis of left hip and elected for surgical management after failing conservative treatment, including anti-inflammatories, pain medication, activity modification, exercises, and assisted devices, among others..  The risks benefits and alternatives were discussed with the patient including but not limited to the risks of nonoperative treatment, versus surgical intervention including infection, bleeding, nerve injury, periprosthetic fracture, the need for revision surgery, dislocation, leg length discrepancy, blood clots, cardiopulmonary complications, morbidity, mortality, among others, and they were willing to proceed.  Predicted outcome is good, although there will be at least a six to nine month expected recovery.    OPERATIVE REPORT     SURGEON:  Teryl Lucy, MD    ASSISTANT:  Janace Litten, OPA-C  (Present throughout the entire procedure,  necessary for completion of procedure in a timely manner, assisting with retraction, instrumentation, and closure)     ANESTHESIA:  General    COMPLICATIONS:  None.     COMPONENTS:  [employer] fit femur size 7 high offset with a 36 mm +5 mm head ball and a pinnacle acetabular shell size 54 millimeter with a 10 degree lipped polyethylene liner    PROCEDURE IN DETAIL:   The patient was met in the holding area and  identified.  The appropriate hip was identified and marked at the operative site.  The patient was then transported to the OR  and  placed under general l anesthesia.  At that point, the patient was  placed in the lateral decubitus position with the operative side up and  secured to the operating  room table and all bony prominences padded.     The operative lower extremity was prepped from the iliac crest to the distal leg.  Sterile draping was performed.  Time out was performed prior to incision.      A routine posterolateral approach was utilized via sharp dissection  carried down to the subcutaneous tissue.  Gross bleeders were Bovie coagulated.  The iliotibial band was identified and incised along the length of the skin incision.  Self-retaining retractors were  inserted.  With the hip internally rotated, the short external rotators  were identified. The piriformis and capsule was tagged with FiberWire, and the hip capsule released in a T-type fashion.  The femoral neck was exposed, and I resected the femoral neck using the appropriate jig. This was performed at approximately a thumb's breadth above the lesser trochanter.    I then exposed the deep acetabulum, cleared out any tissue including the ligamentum teres.  A wing retractor was placed.  After adequate visualization, I excised the labrum, and then sequentially reamed.  I placed the trial acetabulum, which seated nicely, and then impacted the real cup into place.  Appropriate version and inclination was confirmed clinically matching their bony anatomy, and also with the use of the jig.  A trial polyethylene liner was placed and the wing retractor removed.    I then prepared the proximal femur using the cookie-cutter, the lateralizing reamer, and then sequentially reamed and broached.  A trial broach, neck, and head was utilized, and I reduced the hip and it was found to have excellent stability with functional range of motion. The trial components  were then removed, and the real polyethylene liner was placed with the lip directed posteriorly.  I then impacted the real femoral prosthesis into place into the appropriate version, slightly anteverted to the normal anatomy, and I impacted the real head ball into place. The hip was then  reduced and taken through functional range of motion and found to have excellent stability. Leg lengths were restored. The high offset had better stability, and restored the anatomy more accurately, so this was selected.  I then used a 2 mm drill bits to pass the FiberWire suture from the capsule and puriform is through the greater trochanter, and secured this. Excellent posterior capsular repair was achieved. I also closed the T in the capsule.  I then irrigated the hip copiously again with pulse lavage, and repaired the fascia with Vicryl, followed by Vicryl for the subcutaneous tissue, Monocryl for the skin, Steri-Strips and sterile gauze. The wounds were injected. The patient was then awakened and returned to PACU in stable and satisfactory condition. No complications.  Teryl Lucy, MD Orthopedic Surgeon [phone]   06/13/2011 9:44 AM

## 2011-06-13 NOTE — Anesthesia Preprocedure Evaluation (Addendum)
Anesthesia Evaluation  Patient identified by MRN, date of birth, ID band Patient awake    Reviewed: Allergy & Precautions, H&P , NPO status , Patient's Chart, lab work & pertinent test results, reviewed documented beta blocker date and time   History of Anesthesia Complications (+) PONV  Airway Mallampati: II TM Distance: <3 FB Neck ROM: Full    Dental No notable dental hx. (+) Teeth Intact and Dental Advisory Given   Pulmonary neg pulmonary ROS,  clear to auscultation  Pulmonary exam normal       Cardiovascular hypertension, Pt. on medications and Pt. on home beta blockers + CAD and neg cardio ROS regular Normal    Neuro/Psych Negative Neurological ROS  Negative Psych ROS   GI/Hepatic negative GI ROS, Neg liver ROS, GERD-  Controlled and Medicated,  Endo/Other  Negative Endocrine ROSDiabetes mellitus-, Type 2  Renal/GU negative Renal ROS  Genitourinary negative   Musculoskeletal   Abdominal   Peds  Hematology negative hematology ROS (+)   Anesthesia Other Findings   Reproductive/Obstetrics negative OB ROS                         Anesthesia Physical Anesthesia Plan  ASA: III  Anesthesia Plan: General   Post-op Pain Management:    Induction: Intravenous  Airway Management Planned: Oral ETT  Additional Equipment:   Intra-op Plan:   Post-operative Plan: Extubation in OR  Informed Consent: I have reviewed the patients History and Physical, chart, labs and discussed the procedure including the risks, benefits and alternatives for the proposed anesthesia with the patient or authorized representative who has indicated his/her understanding and acceptance.     Plan Discussed with: CRNA  Anesthesia Plan Comments:         Anesthesia Quick Evaluation

## 2011-06-13 NOTE — Plan of Care (Signed)
Problem: Consults Goal: Diagnosis- Total Joint Replacement Outcome: Progressing Primary Total Hip Left
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
## 2011-06-14 LAB — PROTIME-INR: Prothrombin Time: 15.4 seconds — ABNORMAL HIGH (ref 11.6–15.2)

## 2011-06-14 LAB — CBC
HCT: 28 % — ABNORMAL LOW (ref 39.0–52.0)
Hemoglobin: 9.9 g/dL — ABNORMAL LOW (ref 13.0–17.0)
MCH: 30.8 pg (ref 26.0–34.0)
MCHC: 35.4 g/dL (ref 30.0–36.0)
MCV: 87.2 fL (ref 78.0–100.0)
RBC: 3.21 MIL/uL — ABNORMAL LOW (ref 4.22–5.81)

## 2011-06-14 LAB — BASIC METABOLIC PANEL
BUN: 13 mg/dL (ref 6–23)
CO2: 26 mEq/L (ref 19–32)
Calcium: 8.8 mg/dL (ref 8.4–10.5)
Creatinine, Ser: 0.94 mg/dL (ref 0.50–1.35)
GFR calc non Af Amer: 79 mL/min — ABNORMAL LOW (ref >90)
Glucose, Bld: 217 mg/dL — ABNORMAL HIGH (ref 70–99)

## 2011-06-14 MED ORDER — WARFARIN SODIUM 6 MG PO TABS
6.0000 mg | ORAL_TABLET | Freq: Once | ORAL | Status: AC
  Administered 2011-06-14: 6 mg via ORAL
  Filled 2011-06-14: qty 1

## 2011-06-14 NOTE — Progress Notes (Signed)
Physical Therapy Treatment Patient Details Name: Dustin Vincent MRN: [id number] DOB: 1931/07/24 Today's Date: 06/14/2011  PT Assessment/Plan  PT - Assessment/Plan Comments on Treatment Session: Pt admitted for left THA and is very motivated to progress.  Pt tolerated ambulation bid as well as left LE ther ex.  Co-treatment with OT. PT Plan: Discharge plan remains appropriate;Frequency remains appropriate PT Frequency: 7X/week Follow Up Recommendations: Home health PT;Supervision - Intermittent Equipment Recommended: None recommended by PT PT Goals  Acute Rehab PT Goals PT Goal Formulation: With patient Time For Goal Achievement: 7 days PT Goal: Sit to Stand - Progress: Progressing toward goal PT Goal: Stand to Sit - Progress: Progressing toward goal PT Goal: Ambulate - Progress: Progressing toward goal PT Goal: Perform Home Exercise Program - Progress: Progressing toward goal  PT Treatment Precautions/Restrictions  Precautions Precautions: Posterior Hip Precaution Booklet Issued: No Required Braces or Orthoses: No Restrictions Weight Bearing Restrictions: Yes LLE Weight Bearing: Weight bearing as tolerated Other Position/Activity Restrictions: Abduction Pillow in bed. Pain 2/10 in left hip.  Pt repositioned after treatment. Mobility (including Balance) Bed Mobility Bed Mobility: No Supine to Sit: Not tested (comment) Transfers Transfers: Yes Sit to Stand: 1: +2 Total assist;Patient percentage (comment);With upper extremity assist;From chair/3-in-1 ((pt=60%)) Sit to Stand Details (indicate cue type and reason): Assist to translate trunk anterior over BOS.  Cues for hand and left LE placement. Stand to Sit: 1: +2 Total assist;Patient percentage (comment);With upper extremity assist;To chair/3-in-1 ((pt=50%)) Stand to Sit Details: Assist to control descent to chair while maintaining posterior hips precautions.  Cues for hand and left LE placement for  safety. Ambulation/Gait Ambulation/Gait: Yes Ambulation/Gait Assistance: 4: Min assist Ambulation/Gait Assistance Details (indicate cue type and reason): Assist for balance and constant verbal cues for sequence with RW. Ambulation Distance (Feet): 45 Feet Assistive device: Rolling walker Gait Pattern: Step-to pattern;Decreased step length - left;Trunk flexed Stairs: No Wheelchair Mobility Wheelchair Mobility: No  Posture/Postural Control Posture/Postural Control: No significant limitations Balance Balance Assessed: No Exercise  Total Joint Exercises Ankle Circles/Pumps: AROM;Left;10 reps;Supine Quad Sets: AROM;Left;10 reps;Supine Heel Slides: AAROM;Left;10 reps;Supine Long Arc Quad: AAROM;Left;10 reps;Seated End of Session PT - End of Session Equipment Utilized During Treatment: Gait belt Activity Tolerance: Patient tolerated treatment well Patient left: in chair;with call bell in reach Nurse Communication: Mobility status for transfers;Mobility status for ambulation General Behavior During Session: [facility] for tasks performed Cognition: [facility] for tasks performed  Cephus Shelling 06/14/2011, 3:06 PM  06/14/2011 Cephus Shelling, PT, DPT [phone]

## 2011-06-14 NOTE — Progress Notes (Signed)
Subjective: 1 Day Post-Op Procedure(s) (LRB): TOTAL HIP ARTHROPLASTY (Left) Patient reports pain as 6 on 0-10 scale and 7 on 0-10 scale.   Complains of shooting pain down into his foot which starts at his hip. Objective: Vital signs in last 24 hours: Temp:  [97.3 F (36.3 C)-98.5 F (36.9 C)] 97.8 F (36.6 C) (01/16 0657) Pulse Rate:  [50-90] 90  (01/16 0657) Resp:  [8-25] 18  (01/16 0657) BP: (121-157)/(61-82) 157/82 mmHg (01/16 0657) SpO2:  [95 %-100 %] 95 % (01/16 0657) Weight:  [85.73 kg (189 lb)] 85.73 kg (189 lb) (01/15 1500)  Intake/Output from previous day: 01/15 0701 - 01/16 0700 In: 3665 [I.V.:3615; IV Piggyback:50] Out: 2425 [Urine:2275; Blood:150] Intake/Output this shift:     Basename 06/14/11 0605  HGB 9.9*    Basename 06/14/11 0605  WBC 6.6  RBC 3.21*  HCT 28.0*  PLT 133*    Basename 06/14/11 0605  NA 127*  K 4.2  CL 93*  CO2 26  BUN 13  CREATININE 0.94  GLUCOSE 217*  CALCIUM 8.8    Basename 06/14/11 0605  LABPT --  INR 1.19    Sensation intact distally Intact pulses distally Dorsiflexion/Plantar flexion intact Incision: dressing C/D/I and no drainage No CP or SOB Assessment/Plan: 1 Day Post-Op Procedure(s) (LRB): TOTAL HIP ARTHROPLASTY (Left) Advance diet Up with therapy DC Foley per protocol  Transition to PO pain meds ABLA He also has hyponatremia, which seems to be asymptomatic , but we will institute fluid restriction. We will continue to monitor this. Torrie Mayers, BRANDON 06/14/2011, 8:09 AM

## 2011-06-14 NOTE — Progress Notes (Signed)
ANTICOAGULATION CONSULT NOTE -Follow up   Pharmacy Consult for Coumadin Indication: VTE prophylaxis  Allergies  Allergen Reactions  . Sulfa Antibiotics Rash    Was a salve    Patient Measurements: Height: 5\' 11"  (180.3 cm) Weight: 189 lb (85.73 kg) (from medical records 05/04/11) IBW/kg (Calculated) : 75.3    Vital Signs: Temp: 97.8 F (36.6 C) (01/16 0657) BP: 157/82 mmHg (01/16 0657) Pulse Rate: 90  (01/16 0657)  Labs: Preop labs 06/06/11 WBC 4.8, H/H 11.8/32.9, PLTC 153, PT 13, INR 0.96,   Basename 06/14/11 0605  HGB 9.9*  HCT 28.0*  PLT 133*  APTT --  LABPROT 15.4*  INR 1.19  HEPARINUNFRC --  CREATININE 0.94  CKTOTAL --  CKMB --  TROPONINI --   Estimated Creatinine Clearance: 70.1 ml/min (by C-G formula based on Cr of 0.94).  Medical History: Past Medical History  Diagnosis Date  . Hyperlipidemia     takes Zocor daily  . Hypertension     Losartan,Isosorbide,and Metoprolol daily  . Coronary artery disease   . History of blood clots     pt states a filter was placed  . Arthritis   . Joint pain   . Back pain     3 buldging disc  . Constipation     takes a stool softener daily  . History of colon polyps   . Urinary frequency   . Urinary urgency   . Nocturia   . GERD (gastroesophageal reflux disease)     takes Omeprazole daily  . Blood transfusion 2008  . Diabetes mellitus     takes Metformin 1000mg  bid  . Insomnia due to medical condition   . Primary osteoarthritis of left hip 06/13/2011  s/p 3vCABG in 2008, Diastolic Heart failure , TTE 09/2009 preserved LVSF  Medications:  Prescriptions prior to admission  Medication Sig Dispense Refill  . hydrochlorothiazide (HYDRODIURIL) 25 MG tablet Take 25 mg by mouth daily.        . isosorbide mononitrate (IMDUR) 30 MG 24 hr tablet Take 30 mg by mouth daily.        Marland Kitchen losartan (COZAAR) 100 MG tablet Take 100 mg by mouth daily.        . metFORMIN (GLUCOPHAGE) 500 MG tablet Take 1,000 mg by mouth 2 (two)  times daily with a meal.        . metoprolol tartrate (LOPRESSOR) 25 MG tablet Take 25 mg by mouth 2 (two) times daily.        Marland Kitchen omeprazole (PRILOSEC) 20 MG capsule Take 20 mg by mouth 2 (two) times daily.        . simvastatin (ZOCOR) 40 MG tablet Take 20 mg by mouth at bedtime.        Marland Kitchen terazosin (HYTRIN) 10 MG capsule Take 10 mg by mouth at bedtime.        Marland Kitchen DISCONTD: HYDROcodone-acetaminophen (VICODIN) 5-500 MG per tablet Take 1 tablet by mouth every 6 (six) hours as needed. For pain       . DISCONTD: meloxicam (MOBIC) 15 MG tablet Take 15 mg by mouth daily.        Marland Kitchen gabapentin (NEURONTIN) 100 MG capsule Take 200 mg by mouth daily.         Scheduled:     . ceFAZolin (ANCEF) IV  1 g Intravenous Q6H  . coumadin book   Does not apply Once  . enoxaparin  40 mg Subcutaneous QHS  . gabapentin  200 mg Oral Daily  .  hydrochlorothiazide  25 mg Oral Daily  . HYDROmorphone      . isosorbide mononitrate  30 mg Oral Daily  . losartan  100 mg Oral Daily  . metFORMIN  1,000 mg Oral BID WC  . metoprolol tartrate  25 mg Oral BID  . pantoprazole  40 mg Oral Q1200  . simvastatin  20 mg Oral QHS  . terazosin  10 mg Oral QHS  . warfarin  7.5 mg Oral ONCE-1800  . warfarin   Does not apply Once    Assessment: 78 yo WM s/p left THA due to osteoarthritis.  Lovenox 40mg  sq q24hr bridge until INR =or >1.8. INR today 1.19.  H/H 9.9/28, pltc 133K. No bleeding reported.  Goal of Therapy:  INR 2-3   Plan:  Coumadin 6 mg po tonight.  Lovenox to be discontinued when INR =/> 1.8 Monitor daily INR.   Arman Filter 06/14/2011,1:25 PM
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
## 2011-06-14 NOTE — Progress Notes (Signed)
Inpatient Diabetes Program Recommendations  AACE/ADA: New Consensus Statement on Inpatient Glycemic Control (2009)  Target Ranges:  Prepandial:   less than 140 mg/dL      Peak postprandial:   less than 180 mg/dL (1-2 hours)      Critically ill patients:  140 - 180 mg/dL   Reason for Visit: Note history of diabetes.  Lab glucose this morning was 217 mg/dL.  Please order CBG's tid with meals and at bedtime.  Inpatient Diabetes Program Recommendations Correction (SSI): Please add Novolog correction moderate tid with meals. HgbA1C: Please order to assess prehospitalization glycemic control  Note: Also, change diet to CHO modified-medium.

## 2011-06-14 NOTE — Progress Notes (Signed)
UR COMPLETED  

## 2011-06-14 NOTE — Clinical Documentation Improvement (Signed)
GENERIC DOCUMENTATION CLARIFICATION QUERY  THIS DOCUMENT IS NOT A PERMANENT PART OF THE MEDICAL RECORD  TO RESPOND TO THE THIS QUERY, FOLLOW THE INSTRUCTIONS BELOW:  1. If needed, update documentation for the patient's encounter via the notes activity.  2. Access this query again and click edit on the In [employer].  3. After updating, or not, click F2 to complete all highlighted (required) fields concerning your review. Select "additional documentation in the medical record" OR "no additional documentation provided".  4. Click Sign note button.  5. The deficiency will fall out of your In Basket *Please let us know if you are not able to complete this workflow by phone or e-mail (listed below).  Please update your documentation within the medical record to reflect your response to this query.                                                                                        06/14/11   Dear [facility],  In a better effort to capture your patient's severity of illness, reflect appropriate length of stay and utilization of resources, a review of the patient medical record has revealed the following indicators.    Based on your clinical judgment, please clarify and document in a progress note and/or discharge summary the clinical condition associated with the following supporting information:   Possible Clinical Conditions?   _______Hyponatremia _______Other Condition _______Cannot Clinically Determine   Supporting Information:  Lab Results: 1/16: sodium: 127  Treatment:  1/15: 0.45% NaCl w/79meq kcl @ 75cc/hr  You may use possible, probable, or suspect with inpatient documentation. possible, probable, suspected diagnoses MUST be documented at the time of discharge  Reviewed: additional documentation in the medical record  Thank You,  Marciano Sequin,  Clinical Documentation Specialist:  Pager: [phone]  Health Information Management [facility]
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
## 2011-06-15 ENCOUNTER — Encounter (HOSPITAL_COMMUNITY): Payer: Self-pay | Admitting: Orthopedic Surgery

## 2011-06-15 ENCOUNTER — Inpatient Hospital Stay (HOSPITAL_COMMUNITY): Payer: Medicare Other

## 2011-06-15 LAB — BASIC METABOLIC PANEL
BUN: 18 mg/dL (ref 6–23)
Creatinine, Ser: 1.01 mg/dL (ref 0.50–1.35)
GFR calc Af Amer: 81 mL/min — ABNORMAL LOW (ref >90)
GFR calc non Af Amer: 70 mL/min — ABNORMAL LOW (ref >90)
Glucose, Bld: 167 mg/dL — ABNORMAL HIGH (ref 70–99)
Potassium: 4.2 mEq/L (ref 3.5–5.1)

## 2011-06-15 LAB — CBC
HCT: 24.1 % — ABNORMAL LOW (ref 39.0–52.0)
Hemoglobin: 8.4 g/dL — ABNORMAL LOW (ref 13.0–17.0)
MCHC: 34.9 g/dL (ref 30.0–36.0)
MCV: 88 fL (ref 78.0–100.0)
RDW: 12.8 % (ref 11.5–15.5)

## 2011-06-15 LAB — URINE CULTURE: Culture: NO GROWTH

## 2011-06-15 LAB — GLUCOSE, CAPILLARY
Glucose-Capillary: 189 mg/dL — ABNORMAL HIGH (ref 70–99)
Glucose-Capillary: 182 mg/dL — ABNORMAL HIGH (ref 70–99)

## 2011-06-15 MED ORDER — WARFARIN SODIUM 6 MG PO TABS
6.0000 mg | ORAL_TABLET | Freq: Once | ORAL | Status: AC
  Administered 2011-06-15: 6 mg via ORAL
  Filled 2011-06-15: qty 1

## 2011-06-15 MED ORDER — BISACODYL 10 MG RE SUPP
10.0000 mg | Freq: Every day | RECTAL | Status: DC | PRN
  Administered 2011-06-16: 10 mg via RECTAL
  Filled 2011-06-15: qty 1

## 2011-06-15 MED ORDER — SENNOSIDES-DOCUSATE SODIUM 8.6-50 MG PO TABS
1.0000 | ORAL_TABLET | Freq: Every evening | ORAL | Status: DC | PRN
  Administered 2011-06-15: 1 via ORAL
  Filled 2011-06-15: qty 1

## 2011-06-15 MED ORDER — DOCUSATE SODIUM 100 MG PO CAPS
100.0000 mg | ORAL_CAPSULE | Freq: Two times a day (BID) | ORAL | Status: DC
  Administered 2011-06-15 – 2011-06-16 (×3): 100 mg via ORAL
  Filled 2011-06-15 (×3): qty 1

## 2011-06-15 MED ORDER — INSULIN ASPART 100 UNIT/ML ~~LOC~~ SOLN
0.0000 [IU] | Freq: Three times a day (TID) | SUBCUTANEOUS | Status: DC
  Administered 2011-06-15 – 2011-06-16 (×4): 3 [IU] via SUBCUTANEOUS
  Filled 2011-06-15: qty 3

## 2011-06-15 NOTE — Progress Notes (Signed)
Physical Therapy Treatment Patient Details Name: Dustin Vincent MRN: [id number] DOB: [birth year] Today's Date: 06/15/2011  PT Assessment/Plan  PT - Assessment/Plan Comments on Treatment Session: Pt admitted for left THA and is very motivated to progress.  Pt tolerated ambulation this am.  Discussed d/c plans with MD due to no assistance at home.  Pt would benefit from STSNF. PT Plan: Discharge plan needs to be updated;Frequency remains appropriate PT Frequency: 7X/week Follow Up Recommendations: Skilled nursing facility Equipment Recommended: Defer to next venue PT Goals  Acute Rehab PT Goals PT Goal Formulation: With patient Time For Goal Achievement: 7 days PT Goal: Supine/Side to Sit - Progress: Progressing toward goal PT Goal: Sit to Stand - Progress: Progressing toward goal PT Goal: Stand to Sit - Progress: Progressing toward goal PT Goal: Ambulate - Progress: Progressing toward goal PT Goal: Perform Home Exercise Program - Progress: Progressing toward goal  PT Treatment Precautions/Restrictions  Precautions Precautions: Posterior Hip Precaution Booklet Issued: No Precaution Comments: Pt able to recall 2/3 posterior hip precautions.  Verbal cues for 3/3 precautions. Required Braces or Orthoses: No Restrictions Weight Bearing Restrictions: Yes LLE Weight Bearing: Weight bearing as tolerated Other Position/Activity Restrictions: Abduction Pillow in bed. Pain 3/10 in left hip.  Pt repositioned. Mobility (including Balance) Bed Mobility Bed Mobility: Yes Supine to Sit: 3: Mod assist Supine to Sit Details (indicate cue type and reason): Assist for left LE and trunk due to pain with cues for sequence and to maintain posterior hip precautions on left LE. Transfers Transfers: Yes Sit to Stand: 1: +2 Total assist;Patient percentage (comment) ((pt=60%)) Sit to Stand Details (indicate cue type and reason): Assist for trunk to translate anterior over BOS with cues for safest  hand and left LE placement. Stand to Sit: 4: Min assist;With upper extremity assist;To chair/3-in-1 Stand to Sit Details: Assist to slow descent to chair with cues for hand and left LE placement.  Assist to maintain posterior hip precautions. Ambulation/Gait Ambulation/Gait: Yes Ambulation/Gait Assistance: 4: Min assist Ambulation/Gait Assistance Details (indicate cue type and reason): Assist for balance with cues for sequence inside RW.  Pt able to progress to min (guard). Ambulation Distance (Feet): 50 Feet Assistive device: Rolling walker Gait Pattern: Step-to pattern;Decreased step length - left;Trunk flexed Stairs: No Wheelchair Mobility Wheelchair Mobility: No  Posture/Postural Control Posture/Postural Control: No significant limitations Balance Balance Assessed: No Exercise  Total Joint Exercises Ankle Circles/Pumps: AROM;Left;10 reps;Supine Quad Sets: AROM;Left;10 reps;Supine Heel Slides: AAROM;Left;10 reps;Supine End of Session PT - End of Session Equipment Utilized During Treatment: Gait belt Activity Tolerance: Patient tolerated treatment well Patient left: in chair;with call bell in reach Nurse Communication: Mobility status for transfers;Mobility status for ambulation General Behavior During Session: [facility] for tasks performed Cognition: [facility] for tasks performed  Cephus Shelling 06/15/2011, 9:34 AM  06/15/2011 Cephus Shelling, PT, DPT [phone]

## 2011-06-15 NOTE — Discharge Summary (Signed)
Physician Discharge Summary  Patient ID: Dustin Vincent MRN: [id number] DOB/AGE: 03-Mar-1932 78 y.o.  Admit date: 06/13/2011 Discharge date: 06/16/2011  Admission Diagnoses:  Primary osteoarthritis of left hip  Discharge Diagnoses:  Principal Problem:  *Primary osteoarthritis of left hip Hyponatremia Acute blood loss anemia  Past Medical History  Diagnosis Date  . Hyperlipidemia     takes Zocor daily  . Hypertension     Losartan,Isosorbide,and Metoprolol daily  . Coronary artery disease   . History of blood clots     pt states a filter was placed  . Arthritis   . Joint pain   . Back pain     3 buldging disc  . Constipation     takes a stool softener daily  . History of colon polyps   . Urinary frequency   . Urinary urgency   . Nocturia   . GERD (gastroesophageal reflux disease)     takes Omeprazole daily  . Blood transfusion 2008  . Diabetes mellitus     takes Metformin 1000mg  bid  . Insomnia due to medical condition   . Primary osteoarthritis of left hip 06/13/2011    Surgeries: Procedure(s): TOTAL HIP ARTHROPLASTY on 06/13/2011   Consultants (if any):    Discharged Condition: Improved  Hospital Course: Dustin Vincent is an 78 y.o. male who was admitted 06/13/2011 with a diagnosis of Primary osteoarthritis of left hip and went to the operating room on 06/13/2011 and underwent the above named procedures.    He was given perioperative antibiotics:  Anti-infectives     Start     Dose/Rate Route Frequency Ordered Stop   06/13/11 1500   ceFAZolin (ANCEF) IVPB 1 g/50 mL premix        1 g 100 mL/hr over 30 Minutes Intravenous Every 6 hours 06/13/11 1329 06/14/11 0254   06/12/11 1530   ceFAZolin (ANCEF) IVPB 2 g/50 mL premix        2 g 100 mL/hr over 30 Minutes Intravenous 60 min pre-op 06/12/11 1517 06/13/11 0809        .  He was given sequential compression devices, early ambulation, and chemoprophylaxis for DVT prophylaxis.  He benefited maximally  from their hospital stay and there were no complications.    Recent vital signs:  Filed Vitals:   06/15/11 0500  BP: 133/87  Pulse: 86  Temp: 97.9 F (36.6 C)  Resp: 20    Recent laboratory studies:  Lab Results  Component Value Date   HGB 8.4* 06/15/2011   HGB 9.9* 06/14/2011   HGB 11.8* 06/06/2011   Lab Results  Component Value Date   WBC 6.3 06/15/2011   PLT 120* 06/15/2011   Lab Results  Component Value Date   INR 1.39 06/15/2011   Lab Results  Component Value Date   NA 127* 06/15/2011   K 4.2 06/15/2011   CL 92* 06/15/2011   CO2 27 06/15/2011   BUN 18 06/15/2011   CREATININE 1.01 06/15/2011   GLUCOSE 167* 06/15/2011    Discharge Medications:   Current Discharge Medication List    START taking these medications   Details  enoxaparin (LOVENOX) 40 MG/0.4ML SOLN Inject 0.4 mLs (40 mg total) into the skin daily. Qty: 5 Syringe, Refills: 0    methocarbamol (ROBAXIN) 500 MG tablet Take 1 tablet (500 mg total) by mouth 4 (four) times daily. Qty: 75 tablet, Refills: 1    oxyCODONE-acetaminophen (PERCOCET) 10-325 MG per tablet Take 1-2 tablets by mouth every 6 (six) hours  as needed for pain. MAXIMUM TOTAL ACETAMINOPHEN DOSE IS 4000 MG PER DAY Qty: 75 tablet, Refills: 0    warfarin (COUMADIN) 5 MG tablet Take 1 tablet (5 mg total) by mouth daily. Qty: 30 tablet, Refills: 1      CONTINUE these medications which have NOT CHANGED   Details  hydrochlorothiazide (HYDRODIURIL) 25 MG tablet Take 25 mg by mouth daily.      isosorbide mononitrate (IMDUR) 30 MG 24 hr tablet Take 30 mg by mouth daily.      losartan (COZAAR) 100 MG tablet Take 100 mg by mouth daily.      metFORMIN (GLUCOPHAGE) 500 MG tablet Take 1,000 mg by mouth 2 (two) times daily with a meal.      metoprolol tartrate (LOPRESSOR) 25 MG tablet Take 25 mg by mouth 2 (two) times daily.      omeprazole (PRILOSEC) 20 MG capsule Take 20 mg by mouth 2 (two) times daily.      simvastatin (ZOCOR) 40 MG tablet Take 20  mg by mouth at bedtime.      terazosin (HYTRIN) 10 MG capsule Take 10 mg by mouth at bedtime.        STOP taking these medications     HYDROcodone-acetaminophen (VICODIN) 5-500 MG per tablet      meloxicam (MOBIC) 15 MG tablet      gabapentin (NEURONTIN) 100 MG capsule         Diagnostic Studies: Dg Chest 2 View  06/06/2011  *RADIOLOGY REPORT*  Clinical Data: Preop left hip arthroplasty  CHEST - 2 VIEW  Comparison: 10/19/2007  Findings: Prior CABG.  Heart size is upper normal.  Negative for heart failure.  Negative for mass or infiltrate.  Underlying COPD is present.  IMPRESSION: COPD.  No acute cardiopulmonary disease.  Original Report Authenticated By: Camelia Phenes, M.D.   Dg Pelvis Portable  06/13/2011  *RADIOLOGY REPORT*  Clinical Data: Postop left hip replacement.  PORTABLE PELVIS  Comparison: Lateral view performed same time.  Findings: Left total hip replacements appears in satisfactory position.  Lucency of the proximal to mid left femoral shaft may represent overlapping structure rather than fracture and can be assessed on follow-up.  Remote right hip replacement with right acetabular screw entering the pelvis.  Vascular calcifications.  IMPRESSION: Left total hip replacements appears in satisfactory position. Lucency of the proximal to mid left femoral shaft may represent overlapping structure rather than fracture and can be assessed on follow-up.  Remote right hip replacement with right acetabular screw entering the pelvis.  Vascular calcifications.  Original Report Authenticated By: Fuller Canada, M.D.   Dg Hip Portable 1 View Left  06/13/2011  *RADIOLOGY REPORT*  Clinical Data: Postop left hip  PORTABLE LEFT HIP - 1 VIEW  Comparison: Pelvis radiograph 06/13/2011  Findings: Cross-table lateral view of the left hip is performed. The left hip arthroplasty is partially obscured by the patient's right hip arthroplasty.  The femoral head component appears located within the  acetabular component.  No periprosthetic fracture is identified.  IMPRESSION: Left hip arthroplasty appears located.  No complicating feature identified.  Original Report Authenticated By: Britta Mccreedy, M.D.    Disposition: Skilled nursing facility, due to his lack of support at home, advanced age, and limited ambulatory function.  Discharge Orders    Future Orders Please Complete By Expires   Diet general      Call MD / Call 911      Comments:   If you experience chest pain  or shortness of breath, CALL 911 and be transported to the hospital emergency room.  If you develope a fever above 101 F, pus (white drainage) or increased drainage or redness at the wound, or calf pain, call your surgeon's office.   Constipation Prevention      Comments:   Drink plenty of fluids.  Prune juice may be helpful.  You may use a stool softener, such as Colace (over the counter) 100 mg twice a day.  Use MiraLax (over the counter) for constipation as needed.   Increase activity slowly as tolerated      Weight Bearing as taught in Physical Therapy      Comments:   Use a walker or crutches as instructed.   Discharge wound care:      Comments:   If you have a hip bandage, keep it clean and dry.  Change your bandage as instructed by your health care providers.  If your bandage has been discontinued, keep your incision clean and dry.  Pat dry after bathing.  DO NOT put lotion or powder on your incision.   TED hose      Comments:   Use stockings (TED hose) for 2 weeks on both leg(s).  You may remove them at night for sleeping.   Change dressing      Comments:   You may change your dressing in 3 days, then change the dressing daily with sterile 4 x 4 inch gauze dressing and paper tape.  You may clean the incision with alcohol prior to redressing   Follow the hip precautions as taught in Physical Therapy            Signed: Elysha Daw P 06/15/2011, 9:09 AM

## 2011-06-15 NOTE — Progress Notes (Signed)
Physical Therapy Treatment Patient Details Name: Dustin Vincent MRN: [id number] DOB: May 05, 1932 Today's Date: 06/15/2011  PT Assessment/Plan  PT - Assessment/Plan Comments on Treatment Session: Pt admitted for left THA and is very motivated to progress.  Pt tolerated ambulation again with second treatment today. PT Plan: Discharge plan remains appropriate;Frequency remains appropriate PT Frequency: 7X/week Follow Up Recommendations: Skilled nursing facility Equipment Recommended: Defer to next venue PT Goals  Acute Rehab PT Goals PT Goal Formulation: With patient Time For Goal Achievement: 7 days PT Goal: Supine/Side to Sit - Progress: Progressing toward goal PT Goal: Sit to Stand - Progress: Progressing toward goal PT Goal: Stand to Sit - Progress: Progressing toward goal PT Goal: Ambulate - Progress: Progressing toward goal PT Goal: Perform Home Exercise Program - Progress: Progressing toward goal  PT Treatment Precautions/Restrictions  Precautions Precautions: Posterior Hip Precaution Booklet Issued: No Precaution Comments: Pt able to recall 3/3 posterior hip precautions. Required Braces or Orthoses: No Restrictions Weight Bearing Restrictions: Yes LLE Weight Bearing: Weight bearing as tolerated Other Position/Activity Restrictions: Abduction Pillow in bed. Pain 3/10 in left hip with treatment.  Pt repositioned and RN aware. Mobility (including Balance) Bed Mobility Bed Mobility: No Supine to Sit: Not tested (comment) Supine to Sit Details (indicate cue type and reason): Assist for left LE and trunk due to pain with cues for sequence and to maintain posterior hip precautions on left LE. Transfers Transfers: Yes Sit to Stand: 3: Mod assist;With upper extremity assist;From chair/3-in-1 Sit to Stand Details (indicate cue type and reason): Assist for trunk to translate anterior over BOS.  Cues for sequence and placement of left LE to maintain posterior hip  precaution. Stand to Sit: 4: Min assist;With upper extremity assist;To chair/3-in-1 Stand to Sit Details: Assist to slow descent and to maintain posterior hip precautions while sitting. Ambulation/Gait Ambulation/Gait: Yes Ambulation/Gait Assistance: 4: Min assist Ambulation/Gait Assistance Details (indicate cue type and reason): Assist for balance with cues to sequence patient as well as off weight left LE during stance. Ambulation Distance (Feet): 55 Feet Assistive device: Rolling walker Gait Pattern: Step-to pattern;Decreased step length - left;Trunk flexed Stairs: No Wheelchair Mobility Wheelchair Mobility: No  Posture/Postural Control Posture/Postural Control: No significant limitations Balance Balance Assessed: No Exercise  Total Joint Exercises Ankle Circles/Pumps: AROM;Left;10 reps;Supine Quad Sets: AROM;Left;10 reps;Supine Heel Slides: AAROM;Left;10 reps;Supine End of Session PT - End of Session Equipment Utilized During Treatment: Gait belt Activity Tolerance: Patient tolerated treatment well Patient left: in chair;with call bell in reach Nurse Communication: Mobility status for transfers;Mobility status for ambulation General Behavior During Session: [facility] for tasks performed Cognition: [facility] for tasks performed  Cephus Shelling 06/15/2011, 12:48 PM  06/15/2011 Cephus Shelling, PT, DPT [phone]

## 2011-06-15 NOTE — Progress Notes (Signed)
ANTICOAGULATION CONSULT NOTE -Follow up   Pharmacy Consult for Coumadin Indication: VTE prophylaxis  Allergies  Allergen Reactions  . Sulfa Antibiotics Rash    Was a salve    Patient Measurements: Height: 5\' 11"  (180.3 cm) Weight: 189 lb (85.73 kg) (from medical records 05/04/11) IBW/kg (Calculated) : 75.3    Vital Signs: Temp: 97.9 F (36.6 C) (01/17 0500) Temp src: Oral (01/16 2110) BP: 133/87 mmHg (01/17 0500) Pulse Rate: 86  (01/17 0500)   Basename 06/15/11 0645 06/14/11 0605  HGB 8.4* 9.9*  HCT 24.1* 28.0*  PLT 120* 133*  APTT -- --  LABPROT 17.3* 15.4*  INR 1.39 1.19  HEPARINUNFRC -- --  CREATININE 1.01 0.94  CKTOTAL -- --  CKMB -- --  TROPONINI -- --   Estimated Creatinine Clearance: 65.2 ml/min (by C-G formula based on Cr of 1.01).  Medical History: Past Medical History  Diagnosis Date  . Hyperlipidemia     takes Zocor daily  . Hypertension     Losartan,Isosorbide,and Metoprolol daily  . Coronary artery disease   . History of blood clots     pt states a filter was placed  . Arthritis   . Joint pain   . Back pain     3 buldging disc  . Constipation     takes a stool softener daily  . History of colon polyps   . Urinary frequency   . Urinary urgency   . Nocturia   . GERD (gastroesophageal reflux disease)     takes Omeprazole daily  . Blood transfusion 2008  . Diabetes mellitus     takes Metformin 1000mg  bid  . Insomnia due to medical condition   . Primary osteoarthritis of left hip 06/13/2011  s/p 3vCABG in 2008, Diastolic Heart failure , TTE 09/2009 preserved LVSF  Medications:  Prescriptions prior to admission  Medication Sig Dispense Refill  . hydrochlorothiazide (HYDRODIURIL) 25 MG tablet Take 25 mg by mouth daily.        . isosorbide mononitrate (IMDUR) 30 MG 24 hr tablet Take 30 mg by mouth daily.        Marland Kitchen losartan (COZAAR) 100 MG tablet Take 100 mg by mouth daily.        . metFORMIN (GLUCOPHAGE) 500 MG tablet Take 1,000 mg by mouth  2 (two) times daily with a meal.        . metoprolol tartrate (LOPRESSOR) 25 MG tablet Take 25 mg by mouth 2 (two) times daily.        Marland Kitchen omeprazole (PRILOSEC) 20 MG capsule Take 20 mg by mouth 2 (two) times daily.        . simvastatin (ZOCOR) 40 MG tablet Take 20 mg by mouth at bedtime.        Marland Kitchen terazosin (HYTRIN) 10 MG capsule Take 10 mg by mouth at bedtime.        Marland Kitchen DISCONTD: HYDROcodone-acetaminophen (VICODIN) 5-500 MG per tablet Take 1 tablet by mouth every 6 (six) hours as needed. For pain       . DISCONTD: meloxicam (MOBIC) 15 MG tablet Take 15 mg by mouth daily.        Marland Kitchen gabapentin (NEURONTIN) 100 MG capsule Take 200 mg by mouth daily.         Scheduled:     . enoxaparin  40 mg Subcutaneous QHS  . gabapentin  200 mg Oral Daily  . hydrochlorothiazide  25 mg Oral Daily  . isosorbide mononitrate  30 mg Oral Daily  .  losartan  100 mg Oral Daily  . metFORMIN  1,000 mg Oral BID WC  . metoprolol tartrate  25 mg Oral BID  . pantoprazole  40 mg Oral Q1200  . simvastatin  20 mg Oral QHS  . terazosin  10 mg Oral QHS  . warfarin  6 mg Oral ONCE-1800  . warfarin   Does not apply Once    Assessment: 79 yo WM s/p left THA due to osteoarthritis.  INR rising to goal. Lovenox 40mg  sq q24hr bridge until INR =or >1.8. No bleeding reported.   Goal of Therapy:  INR 2-3   Plan:  Coumadin 6 mg po tonight.  Lovenox to be continued until INR =/> 1.8 Monitor daily INR.   Elson Clan 06/15/2011,8:37 AM

## 2011-06-15 NOTE — Progress Notes (Signed)
Procedure(s) (LRB): TOTAL HIP ARTHROPLASTY (Left) 2 Days Post-Op   Subjective:  Patient reports pain as mild.  He is working with therapy and making steady progress. He does not have anyone at home to help manage him. He reports that he has not been taking Neurontin prior to his hospitalization. He also has a history of constipation.  Objective:   VITALS:  BP 133/87  Pulse 86  Temp(Src) 97.9 F (36.6 C) (Oral)  Resp 20  Ht 5\' 11"  (1.803 m)  Wt 85.73 kg (189 lb)  BMI 26.36 kg/m2  SpO2 94%  Neurologically intact ABD soft Sensation intact distally Incision: dressing C/D/I  LABS Lab Results  Component Value Date   HGB 8.4* 06/15/2011   HGB 9.9* 06/14/2011   HGB 11.8* 06/06/2011   Lab Results  Component Value Date   WBC 6.3 06/15/2011   PLT 120* 06/15/2011   Lab Results  Component Value Date   INR 1.39 06/15/2011   Lab Results  Component Value Date   NA 127* 06/15/2011   K 4.2 06/15/2011   CL 92* 06/15/2011   CO2 27 06/15/2011   BUN 18 06/15/2011   CREATININE 1.01 06/15/2011   GLUCOSE 167* 06/15/2011    Assessment/Plan: Principal Problem:  *Primary osteoarthritis of left hip  Acute blood loss anemia: Observed Hyponatremia, asymptomatic, continue to observe, fluid restriction 1500 mL per day. Diabetes, monitor blood sugars  Advance diet Up with therapy Discharge to SNF  Skilled nursing likely tomorrow, January 18. Discharge summary completed and FL-2 signed.  Treston Coker P 06/15/2011, 9:10 AM

## 2011-06-15 NOTE — Plan of Care (Signed)
Problem: Discharge Progression Outcomes Goal: Barriers To Progression Addressed/Resolved Outcome: Progressing Pt. For rehab. Pending d/c 1/18

## 2011-06-15 NOTE — Progress Notes (Signed)
CSW Psychosocial Assessment and FL2 placed in shadow chart in [facility]. SNF bed confirmed for pt at [facility]. This is patient's first choice. Plan d/c tomorrow if stable per MD> Reece Levy, MSW, [place] [phone]

## 2011-06-16 ENCOUNTER — Inpatient Hospital Stay (HOSPITAL_COMMUNITY)
Admission: EM | Admit: 2011-06-16 | Discharge: 2011-06-19 | DRG: 641 | Disposition: A | Discharge status: Skilled Nursing Facility | Payer: Medicare Other | Attending: Internal Medicine | Admitting: Internal Medicine

## 2011-06-16 ENCOUNTER — Encounter (HOSPITAL_COMMUNITY): Payer: Self-pay | Admitting: *Deleted

## 2011-06-16 DIAGNOSIS — R509 Fever, unspecified: Secondary | ICD-10-CM | POA: Diagnosis not present

## 2011-06-16 DIAGNOSIS — E861 Hypovolemia: Secondary | ICD-10-CM | POA: Diagnosis present

## 2011-06-16 DIAGNOSIS — T46905A Adverse effect of unspecified agents primarily affecting the cardiovascular system, initial encounter: Secondary | ICD-10-CM | POA: Diagnosis present

## 2011-06-16 DIAGNOSIS — J9819 Other pulmonary collapse: Secondary | ICD-10-CM | POA: Diagnosis present

## 2011-06-16 DIAGNOSIS — Z951 Presence of aortocoronary bypass graft: Secondary | ICD-10-CM | POA: Diagnosis not present

## 2011-06-16 DIAGNOSIS — E119 Type 2 diabetes mellitus without complications: Secondary | ICD-10-CM | POA: Diagnosis not present

## 2011-06-16 DIAGNOSIS — D62 Acute posthemorrhagic anemia: Secondary | ICD-10-CM | POA: Diagnosis present

## 2011-06-16 DIAGNOSIS — D649 Anemia, unspecified: Secondary | ICD-10-CM | POA: Diagnosis present

## 2011-06-16 DIAGNOSIS — I10 Essential (primary) hypertension: Secondary | ICD-10-CM | POA: Diagnosis present

## 2011-06-16 DIAGNOSIS — M1612 Unilateral primary osteoarthritis, left hip: Secondary | ICD-10-CM

## 2011-06-16 DIAGNOSIS — E871 Hypo-osmolality and hyponatremia: Principal | ICD-10-CM | POA: Diagnosis present

## 2011-06-16 DIAGNOSIS — M25559 Pain in unspecified hip: Secondary | ICD-10-CM | POA: Diagnosis not present

## 2011-06-16 DIAGNOSIS — E860 Dehydration: Secondary | ICD-10-CM | POA: Diagnosis not present

## 2011-06-16 DIAGNOSIS — I2510 Atherosclerotic heart disease of native coronary artery without angina pectoris: Secondary | ICD-10-CM | POA: Diagnosis present

## 2011-06-16 DIAGNOSIS — M25552 Pain in left hip: Secondary | ICD-10-CM | POA: Diagnosis present

## 2011-06-16 DIAGNOSIS — T502X5A Adverse effect of carbonic-anhydrase inhibitors, benzothiadiazides and other diuretics, initial encounter: Secondary | ICD-10-CM | POA: Diagnosis present

## 2011-06-16 DIAGNOSIS — Z96649 Presence of unspecified artificial hip joint: Secondary | ICD-10-CM

## 2011-06-16 DIAGNOSIS — I517 Cardiomegaly: Secondary | ICD-10-CM | POA: Diagnosis not present

## 2011-06-16 DIAGNOSIS — Z471 Aftercare following joint replacement surgery: Secondary | ICD-10-CM | POA: Diagnosis not present

## 2011-06-16 LAB — PROTIME-INR: INR: 1.45 (ref 0.00–1.49)

## 2011-06-16 LAB — URINALYSIS, ROUTINE W REFLEX MICROSCOPIC
Bilirubin Urine: NEGATIVE
Nitrite: NEGATIVE
Protein, ur: 30 mg/dL — AB
Urobilinogen, UA: 0.2 mg/dL (ref 0.0–1.0)

## 2011-06-16 LAB — CBC
HCT: 24.8 % — ABNORMAL LOW (ref 39.0–52.0)
MCHC: 35.9 g/dL (ref 30.0–36.0)
MCV: 87.3 fL (ref 78.0–100.0)
Platelets: 133 10*3/uL — ABNORMAL LOW (ref 150–400)
RDW: 12.4 % (ref 11.5–15.5)
WBC: 8.3 10*3/uL (ref 4.0–10.5)

## 2011-06-16 LAB — URINE MICROSCOPIC-ADD ON

## 2011-06-16 LAB — DIFFERENTIAL
Basophils Absolute: 0 10*3/uL (ref 0.0–0.1)
Lymphocytes Relative: 10 % — ABNORMAL LOW (ref 12–46)
Neutro Abs: 6.7 10*3/uL (ref 1.7–7.7)

## 2011-06-16 NOTE — Progress Notes (Signed)
CSW spoke with [facility] and they are ready to accept the patient. CSW is awaiting D/C summary.

## 2011-06-16 NOTE — Progress Notes (Signed)
ANTICOAGULATION CONSULT NOTE -Follow up   Pharmacy Consult for Coumadin Indication: VTE prophylaxis  Allergies  Allergen Reactions  . Sulfa Antibiotics Rash    Was a salve    Patient Measurements: Height: 5\' 11"  (180.3 cm) Weight: 189 lb (85.73 kg) (from medical records 05/04/11) IBW/kg (Calculated) : 75.3    Vital Signs: Temp: 98.9 F (37.2 C) (01/18 0558) BP: 128/66 mmHg (01/18 0558) Pulse Rate: 78  (01/18 0558)   Basename 06/16/11 0530 06/15/11 0645 06/14/11 0605  HGB 8.9* 8.4* --  HCT 24.8* 24.1* 28.0*  PLT 133* 120* 133*  APTT -- -- --  LABPROT 17.9* 17.3* 15.4*  INR 1.45 1.39 1.19  HEPARINUNFRC -- -- --  CREATININE -- 1.01 0.94  CKTOTAL -- -- --  CKMB -- -- --  TROPONINI -- -- --   Estimated Creatinine Clearance: 65.2 ml/min (by C-G formula based on Cr of 1.01).  Medical History: Past Medical History  Diagnosis Date  . Hyperlipidemia     takes Zocor daily  . Hypertension     Losartan,Isosorbide,and Metoprolol daily  . Coronary artery disease   . History of blood clots     pt states a filter was placed  . Arthritis   . Joint pain   . Back pain     3 buldging disc  . Constipation     takes a stool softener daily  . History of colon polyps   . Urinary frequency   . Urinary urgency   . Nocturia   . GERD (gastroesophageal reflux disease)     takes Omeprazole daily  . Blood transfusion 2008  . Diabetes mellitus     takes Metformin 1000mg  bid  . Insomnia due to medical condition   . Primary osteoarthritis of left hip 06/13/2011  s/p 3vCABG in 2008, Diastolic Heart failure , TTE 09/2009 preserved LVSF  Medications:  Prescriptions prior to admission  Medication Sig Dispense Refill  . hydrochlorothiazide (HYDRODIURIL) 25 MG tablet Take 25 mg by mouth daily.        . isosorbide mononitrate (IMDUR) 30 MG 24 hr tablet Take 30 mg by mouth daily.        Marland Kitchen losartan (COZAAR) 100 MG tablet Take 100 mg by mouth daily.        . metFORMIN (GLUCOPHAGE) 500 MG  tablet Take 1,000 mg by mouth 2 (two) times daily with a meal.        . metoprolol tartrate (LOPRESSOR) 25 MG tablet Take 25 mg by mouth 2 (two) times daily.        Marland Kitchen omeprazole (PRILOSEC) 20 MG capsule Take 20 mg by mouth 2 (two) times daily.        . simvastatin (ZOCOR) 40 MG tablet Take 20 mg by mouth at bedtime.        Marland Kitchen terazosin (HYTRIN) 10 MG capsule Take 10 mg by mouth at bedtime.        Marland Kitchen DISCONTD: HYDROcodone-acetaminophen (VICODIN) 5-500 MG per tablet Take 1 tablet by mouth every 6 (six) hours as needed. For pain       . DISCONTD: meloxicam (MOBIC) 15 MG tablet Take 15 mg by mouth daily.        Marland Kitchen DISCONTD: gabapentin (NEURONTIN) 100 MG capsule Take 200 mg by mouth daily.         Scheduled:     . docusate sodium  100 mg Oral BID  . enoxaparin  40 mg Subcutaneous QHS  . hydrochlorothiazide  25 mg Oral Daily  .  insulin aspart  0-15 Units Subcutaneous TID WC  . isosorbide mononitrate  30 mg Oral Daily  . losartan  100 mg Oral Daily  . metFORMIN  1,000 mg Oral BID WC  . metoprolol tartrate  25 mg Oral BID  . pantoprazole  40 mg Oral Q1200  . simvastatin  20 mg Oral QHS  . terazosin  10 mg Oral QHS  . warfarin  6 mg Oral ONCE-1800  . DISCONTD: gabapentin  200 mg Oral Daily    Assessment: 78 yo WM s/p left THA due to osteoarthritis.  INR rising to goal. Lovenox 40mg  sq q24hr bridge until INR =or >1.8. No bleeding reported. Noted plans to discharge to [facility] today if able.  Goal of Therapy:  INR 2-3   Plan:  1) Agree with discharge plans for warfarin 5mg  daily to overlap with Lovenox until INR> 1.8 & close INR monitoring. 2) Repeat Coumadin 6 mg po tonight if pt not discharged.Elson Clan 06/16/2011,8:17 AM

## 2011-06-16 NOTE — Progress Notes (Signed)
Pt c/o left groin pain. Stated it started hurting after moving from chair to bed. No swelling noted. Pain med given and ice applied with good results.  Pt also had elevated temp of 101.6. Zonia Kief notified. cxr completed. UA sent. After tylenol temp declined to 99.4.

## 2011-06-16 NOTE — Progress Notes (Signed)
Subjective: 3 Days Post-Op Procedure(s) (LRB): TOTAL HIP ARTHROPLASTY (Left) Patient reports pain as 4 on 0-10 scale and 5 on 0-10 scale.    Objective: Vital signs in last 24 hours: Temp:  [98.9 F (37.2 C)-101.2 F (38.4 C)] 98.9 F (37.2 C) (01/18 0558) Pulse Rate:  [64-93] 78  (01/18 0558) Resp:  [18-20] 18  (01/18 0558) BP: (107-128)/(57-83) 128/66 mmHg (01/18 0558) SpO2:  [90 %-95 %] 95 % (01/18 0558)  Intake/Output from previous day: 01/17 0701 - 01/18 0700 In: 480 [P.O.:480] Out: 400 [Urine:400] Intake/Output this shift:     Basename 06/16/11 0530 06/15/11 0645 06/14/11 0605  HGB 8.9* 8.4* 9.9*    Basename 06/16/11 0530 06/15/11 0645  WBC 8.3 6.3  RBC 2.84* 2.74*  HCT 24.8* 24.1*  PLT 133* 120*    Basename 06/15/11 0645 06/14/11 0605  NA 127* 127*  K 4.2 4.2  CL 92* 93*  CO2 27 26  BUN 18 13  CREATININE 1.01 0.94  GLUCOSE 167* 217*  CALCIUM 8.6 8.8    Basename 06/16/11 0530 06/15/11 0645  LABPT -- --  INR 1.45 1.39    Sensation intact distally Intact pulses distally Dorsiflexion/Plantar flexion intact Incision: scant drainage Clean Dressing applied  Assessment/Plan: 3 Days Post-Op Procedure(s) (LRB): TOTAL HIP ARTHROPLASTY (Left) Advance diet Up with therapy Discharge to SNF  [facility], [place] 06/16/2011, 9:34 AM

## 2011-06-16 NOTE — Progress Notes (Signed)
Physical Therapy Treatment Patient Details Name: Dustin Vincent MRN: [id number] DOB: 27-Aug-1931 Today's Date: 06/16/2011  PT Assessment/Plan  PT - Assessment/Plan Comments on Treatment Session: Pt admitted for left THA and is very motivated to progress.  Pt tolerated treatment this am and would benefit from continued PT at [facility] setting. PT Plan: Discharge plan remains appropriate;Frequency remains appropriate PT Frequency: 7X/week Follow Up Recommendations: Skilled nursing facility Equipment Recommended: Defer to next venue PT Goals  Acute Rehab PT Goals PT Goal Formulation: With patient Time For Goal Achievement: 7 days PT Goal: Supine/Side to Sit - Progress: Progressing toward goal PT Goal: Sit to Stand - Progress: Progressing toward goal PT Goal: Stand to Sit - Progress: Progressing toward goal PT Goal: Ambulate - Progress: Progressing toward goal PT Goal: Perform Home Exercise Program - Progress: Progressing toward goal  PT Treatment Precautions/Restrictions  Precautions Precautions: Posterior Hip Precaution Booklet Issued: No Precaution Comments: Pt able to recall 3/3 posterior hip precautions. Required Braces or Orthoses: No Restrictions Weight Bearing Restrictions: Yes LLE Weight Bearing: Weight bearing as tolerated Other Position/Activity Restrictions: Abduction Pillow in bed. Pain 3/10 in left hip.  Pt repositioned. Mobility (including Balance) Bed Mobility Bed Mobility: Yes Supine to Sit: 4: Min assist;HOB flat;With rails Supine to Sit Details (indicate cue type and reason): Assist for left LE and to maintain posterior hip precautions throughout motion.  Cues for sequence. Transfers Transfers: Yes Sit to Stand: 3: Mod assist;With upper extremity assist;From bed Sit to Stand Details (indicate cue type and reason): Assist for trunk to translate over BOS with cues for maintain posterior hip precautions as well as hand placement. Stand to Sit: 4: Min assist;With  upper extremity assist;To chair/3-in-1 Stand to Sit Details: Assist to slow descent and maintain posterior hip precautions by preventing increased hip flexion and abduction. Ambulation/Gait Ambulation/Gait: Yes Ambulation/Gait Assistance: 4: Min assist Ambulation/Gait Assistance Details (indicate cue type and reason): Assist for balance and to off weight left LE.  Cues for sequence and extend posture. Ambulation Distance (Feet): 85 Feet Assistive device: Rolling walker Gait Pattern: Step-to pattern;Decreased step length - left;Trunk flexed Stairs: No Wheelchair Mobility Wheelchair Mobility: No  Posture/Postural Control Posture/Postural Control: No significant limitations Balance Balance Assessed: No Exercise  Total Joint Exercises Ankle Circles/Pumps: AROM;Left;10 reps;Supine Quad Sets: AROM;Left;10 reps;Supine Short Arc Quad: AROM;Left;10 reps;Supine Heel Slides: AAROM;Left;10 reps;Supine Hip ABduction/ADduction: AAROM;Left;10 reps;Supine End of Session PT - End of Session Equipment Utilized During Treatment: Gait belt Activity Tolerance: Patient tolerated treatment well Patient left: in chair;with call bell in reach Nurse Communication: Mobility status for transfers;Mobility status for ambulation General Behavior During Session: [facility] for tasks performed Cognition: [facility] for tasks performed  Cephus Shelling 06/16/2011, 10:32 AM  06/16/2011 Cephus Shelling, PT, DPT [phone]

## 2011-06-16 NOTE — ED Notes (Signed)
Pt had new hip placed on Tuesday. Pt c/o uncontrollable pain.

## 2011-06-17 ENCOUNTER — Observation Stay (HOSPITAL_COMMUNITY): Payer: Medicare Other

## 2011-06-17 ENCOUNTER — Emergency Department (HOSPITAL_COMMUNITY): Payer: Medicare Other

## 2011-06-17 DIAGNOSIS — J438 Other emphysema: Secondary | ICD-10-CM | POA: Diagnosis not present

## 2011-06-17 DIAGNOSIS — E119 Type 2 diabetes mellitus without complications: Secondary | ICD-10-CM | POA: Diagnosis present

## 2011-06-17 DIAGNOSIS — D62 Acute posthemorrhagic anemia: Secondary | ICD-10-CM | POA: Diagnosis present

## 2011-06-17 DIAGNOSIS — J9819 Other pulmonary collapse: Secondary | ICD-10-CM | POA: Diagnosis present

## 2011-06-17 DIAGNOSIS — M25552 Pain in left hip: Secondary | ICD-10-CM | POA: Diagnosis present

## 2011-06-17 DIAGNOSIS — E871 Hypo-osmolality and hyponatremia: Secondary | ICD-10-CM | POA: Diagnosis not present

## 2011-06-17 DIAGNOSIS — J984 Other disorders of lung: Secondary | ICD-10-CM | POA: Diagnosis not present

## 2011-06-17 DIAGNOSIS — R4182 Altered mental status, unspecified: Secondary | ICD-10-CM | POA: Diagnosis not present

## 2011-06-17 DIAGNOSIS — I2510 Atherosclerotic heart disease of native coronary artery without angina pectoris: Secondary | ICD-10-CM | POA: Diagnosis present

## 2011-06-17 DIAGNOSIS — M7989 Other specified soft tissue disorders: Secondary | ICD-10-CM | POA: Diagnosis not present

## 2011-06-17 DIAGNOSIS — R262 Difficulty in walking, not elsewhere classified: Secondary | ICD-10-CM | POA: Diagnosis not present

## 2011-06-17 DIAGNOSIS — I10 Essential (primary) hypertension: Secondary | ICD-10-CM | POA: Diagnosis present

## 2011-06-17 DIAGNOSIS — Z96649 Presence of unspecified artificial hip joint: Secondary | ICD-10-CM | POA: Diagnosis not present

## 2011-06-17 DIAGNOSIS — M79609 Pain in unspecified limb: Secondary | ICD-10-CM | POA: Diagnosis not present

## 2011-06-17 DIAGNOSIS — E861 Hypovolemia: Secondary | ICD-10-CM | POA: Diagnosis present

## 2011-06-17 DIAGNOSIS — R5381 Other malaise: Secondary | ICD-10-CM | POA: Diagnosis not present

## 2011-06-17 DIAGNOSIS — IMO0001 Reserved for inherently not codable concepts without codable children: Secondary | ICD-10-CM | POA: Diagnosis not present

## 2011-06-17 DIAGNOSIS — D649 Anemia, unspecified: Secondary | ICD-10-CM | POA: Diagnosis not present

## 2011-06-17 DIAGNOSIS — Z951 Presence of aortocoronary bypass graft: Secondary | ICD-10-CM | POA: Diagnosis not present

## 2011-06-17 DIAGNOSIS — E860 Dehydration: Secondary | ICD-10-CM | POA: Diagnosis not present

## 2011-06-17 DIAGNOSIS — Z471 Aftercare following joint replacement surgery: Secondary | ICD-10-CM | POA: Diagnosis not present

## 2011-06-17 DIAGNOSIS — R509 Fever, unspecified: Secondary | ICD-10-CM | POA: Diagnosis not present

## 2011-06-17 DIAGNOSIS — I517 Cardiomegaly: Secondary | ICD-10-CM | POA: Diagnosis not present

## 2011-06-17 DIAGNOSIS — I6789 Other cerebrovascular disease: Secondary | ICD-10-CM | POA: Diagnosis not present

## 2011-06-17 DIAGNOSIS — R0602 Shortness of breath: Secondary | ICD-10-CM | POA: Diagnosis not present

## 2011-06-17 DIAGNOSIS — Z5189 Encounter for other specified aftercare: Secondary | ICD-10-CM | POA: Diagnosis not present

## 2011-06-17 DIAGNOSIS — M25559 Pain in unspecified hip: Secondary | ICD-10-CM | POA: Diagnosis not present

## 2011-06-17 LAB — URINALYSIS, ROUTINE W REFLEX MICROSCOPIC
Bilirubin Urine: NEGATIVE
Nitrite: NEGATIVE
Specific Gravity, Urine: 1.02 (ref 1.005–1.030)
pH: 5.5 (ref 5.0–8.0)

## 2011-06-17 LAB — CBC
HCT: 22.8 % — ABNORMAL LOW (ref 39.0–52.0)
HCT: 23.3 % — ABNORMAL LOW (ref 39.0–52.0)
Hemoglobin: 8.3 g/dL — ABNORMAL LOW (ref 13.0–17.0)
Hemoglobin: 8.4 g/dL — ABNORMAL LOW (ref 13.0–17.0)
MCH: 31.5 pg (ref 26.0–34.0)
MCH: 31.6 pg (ref 26.0–34.0)
MCHC: 36 g/dL (ref 30.0–36.0)
MCV: 87.6 fL (ref 78.0–100.0)
MCV: 87.7 fL (ref 78.0–100.0)
RBC: 2.65 MIL/uL — ABNORMAL LOW (ref 4.22–5.81)
RBC: 2.66 MIL/uL — ABNORMAL LOW (ref 4.22–5.81)
RDW: 12.4 % (ref 11.5–15.5)

## 2011-06-17 LAB — COMPREHENSIVE METABOLIC PANEL
ALT: 15 U/L (ref 0–53)
AST: 34 U/L (ref 0–37)
Albumin: 2.6 g/dL — ABNORMAL LOW (ref 3.5–5.2)
Alkaline Phosphatase: 61 U/L (ref 39–117)
Chloride: 88 mEq/L — ABNORMAL LOW (ref 96–112)
Potassium: 4.1 mEq/L (ref 3.5–5.1)
Sodium: 123 mEq/L — ABNORMAL LOW (ref 135–145)
Total Protein: 5.9 g/dL — ABNORMAL LOW (ref 6.0–8.3)

## 2011-06-17 LAB — IRON AND TIBC
Saturation Ratios: 12 % — ABNORMAL LOW (ref 20–55)
UIBC: 293 ug/dL (ref 125–400)

## 2011-06-17 LAB — URINE MICROSCOPIC-ADD ON

## 2011-06-17 LAB — GLUCOSE, CAPILLARY: Glucose-Capillary: 158 mg/dL — ABNORMAL HIGH (ref 70–99)

## 2011-06-17 LAB — BASIC METABOLIC PANEL
CO2: 28 mEq/L (ref 19–32)
Calcium: 8.7 mg/dL (ref 8.4–10.5)
Chloride: 85 mEq/L — ABNORMAL LOW (ref 96–112)
Creatinine, Ser: 0.91 mg/dL (ref 0.50–1.35)
Creatinine, Ser: 1.06 mg/dL (ref 0.50–1.35)
GFR calc Af Amer: >90 mL/min (ref >90)
GFR calc non Af Amer: 80 mL/min — ABNORMAL LOW (ref >90)
Glucose, Bld: 186 mg/dL — ABNORMAL HIGH (ref 70–99)
Potassium: 3.8 mEq/L (ref 3.5–5.1)
Sodium: 121 mEq/L — ABNORMAL LOW (ref 135–145)

## 2011-06-17 LAB — RETICULOCYTES
RBC.: 2.6 MIL/uL — ABNORMAL LOW (ref 4.22–5.81)
Retic Count, Absolute: 46.8 10*3/uL (ref 19.0–186.0)
Retic Ct Pct: 1.8 % (ref 0.4–3.1)

## 2011-06-17 LAB — PROTIME-INR
INR: 1.41 (ref 0.00–1.49)
INR: 1.48 (ref 0.00–1.49)
Prothrombin Time: 18.2 seconds — ABNORMAL HIGH (ref 11.6–15.2)

## 2011-06-17 LAB — VITAMIN B12: Vitamin B-12: 313 pg/mL (ref 211–911)

## 2011-06-17 LAB — CARDIAC PANEL(CRET KIN+CKTOT+MB+TROPI)
CK, MB: 2.6 ng/mL (ref 0.3–4.0)
Relative Index: 0.3 (ref 0.0–2.5)
Total CK: 876 U/L — ABNORMAL HIGH (ref 7–232)

## 2011-06-17 LAB — HEMOGLOBIN A1C: Hgb A1c MFr Bld: 7.3 % — ABNORMAL HIGH (ref <5.7)

## 2011-06-17 LAB — FOLATE: Folate: 7.9 ng/mL

## 2011-06-17 MED ORDER — HYDROMORPHONE HCL PF 1 MG/ML IJ SOLN
1.0000 mg | INTRAMUSCULAR | Status: DC | PRN
  Administered 2011-06-17 – 2011-06-19 (×5): 1 mg via INTRAVENOUS
  Filled 2011-06-17 (×6): qty 1

## 2011-06-17 MED ORDER — ISOSORBIDE MONONITRATE ER 60 MG PO TB24
30.0000 mg | ORAL_TABLET | Freq: Every day | ORAL | Status: DC
  Administered 2011-06-17: 09:00:00 via ORAL
  Administered 2011-06-18 – 2011-06-19 (×2): 30 mg via ORAL
  Filled 2011-06-17 (×3): qty 1

## 2011-06-17 MED ORDER — SODIUM CHLORIDE 0.9 % IJ SOLN
INTRAMUSCULAR | Status: AC
  Administered 2011-06-17: 18:00:00
  Filled 2011-06-17: qty 3

## 2011-06-17 MED ORDER — ONDANSETRON HCL 4 MG/2ML IJ SOLN: 4.0000 mg | Freq: Four times a day (QID) | INTRAMUSCULAR | Status: DC | PRN

## 2011-06-17 MED ORDER — IOHEXOL 350 MG/ML SOLN
100.0000 mL | Freq: Once | INTRAVENOUS | Status: AC | PRN
  Administered 2011-06-17: 100 mL via INTRAVENOUS

## 2011-06-17 MED ORDER — SIMVASTATIN 20 MG PO TABS
20.0000 mg | ORAL_TABLET | Freq: Every day | ORAL | Status: DC
  Administered 2011-06-17 – 2011-06-18 (×2): 20 mg via ORAL
  Filled 2011-06-17 (×2): qty 1

## 2011-06-17 MED ORDER — ENOXAPARIN SODIUM 40 MG/0.4ML ~~LOC~~ SOLN
40.0000 mg | SUBCUTANEOUS | Status: DC
  Administered 2011-06-17 – 2011-06-19 (×3): 40 mg via SUBCUTANEOUS
  Filled 2011-06-17 (×3): qty 0.4

## 2011-06-17 MED ORDER — WARFARIN SODIUM 7.5 MG PO TABS
7.5000 mg | ORAL_TABLET | Freq: Once | ORAL | Status: AC
  Administered 2011-06-17: 7.5 mg via ORAL
  Filled 2011-06-17: qty 1

## 2011-06-17 MED ORDER — ACETAMINOPHEN 325 MG PO TABS: 650.0000 mg | ORAL_TABLET | Freq: Four times a day (QID) | ORAL | Status: DC | PRN

## 2011-06-17 MED ORDER — AMLODIPINE BESYLATE 5 MG PO TABS
5.0000 mg | ORAL_TABLET | Freq: Every day | ORAL | Status: DC
  Administered 2011-06-18 – 2011-06-19 (×2): 5 mg via ORAL
  Filled 2011-06-17 (×2): qty 1

## 2011-06-17 MED ORDER — LOSARTAN POTASSIUM 50 MG PO TABS
100.0000 mg | ORAL_TABLET | Freq: Every day | ORAL | Status: DC
  Administered 2011-06-17: 100 mg via ORAL
  Filled 2011-06-17: qty 2

## 2011-06-17 MED ORDER — INSULIN ASPART 100 UNIT/ML ~~LOC~~ SOLN
0.0000 [IU] | Freq: Three times a day (TID) | SUBCUTANEOUS | Status: DC
  Administered 2011-06-17 – 2011-06-18 (×4): 3 [IU] via SUBCUTANEOUS
  Administered 2011-06-18: 2 [IU] via SUBCUTANEOUS
  Administered 2011-06-18: 8 [IU] via SUBCUTANEOUS
  Administered 2011-06-19: 3 [IU] via SUBCUTANEOUS
  Administered 2011-06-19: 5 [IU] via SUBCUTANEOUS
  Filled 2011-06-17: qty 3

## 2011-06-17 MED ORDER — ONDANSETRON HCL 4 MG PO TABS: 4.0000 mg | ORAL_TABLET | Freq: Four times a day (QID) | ORAL | Status: DC | PRN

## 2011-06-17 MED ORDER — DOCUSATE SODIUM 100 MG PO CAPS
100.0000 mg | ORAL_CAPSULE | Freq: Two times a day (BID) | ORAL | Status: DC
  Administered 2011-06-17 – 2011-06-19 (×5): 100 mg via ORAL
  Filled 2011-06-17 (×6): qty 1

## 2011-06-17 MED ORDER — METOPROLOL TARTRATE 25 MG PO TABS
25.0000 mg | ORAL_TABLET | Freq: Two times a day (BID) | ORAL | Status: DC
  Administered 2011-06-17 – 2011-06-19 (×5): 25 mg via ORAL
  Filled 2011-06-17 (×5): qty 1

## 2011-06-17 MED ORDER — ALBUTEROL SULFATE (5 MG/ML) 0.5% IN NEBU: 2.5000 mg | INHALATION_SOLUTION | RESPIRATORY_TRACT | Status: DC | PRN

## 2011-06-17 MED ORDER — OXYCODONE-ACETAMINOPHEN 5-325 MG PO TABS
1.0000 | ORAL_TABLET | Freq: Four times a day (QID) | ORAL | Status: DC | PRN
  Administered 2011-06-17 – 2011-06-18 (×4): 1 via ORAL
  Administered 2011-06-19 (×2): 2 via ORAL
  Filled 2011-06-17 (×2): qty 2
  Filled 2011-06-17 (×4): qty 1

## 2011-06-17 MED ORDER — PANTOPRAZOLE SODIUM 40 MG PO TBEC
40.0000 mg | DELAYED_RELEASE_TABLET | Freq: Every day | ORAL | Status: DC
  Administered 2011-06-17 – 2011-06-19 (×3): 40 mg via ORAL
  Filled 2011-06-17 (×3): qty 1

## 2011-06-17 MED ORDER — HYDROMORPHONE HCL PF 1 MG/ML IJ SOLN
1.0000 mg | Freq: Once | INTRAMUSCULAR | Status: AC
Start: 2011-06-17 — End: 2011-06-17
  Administered 2011-06-17: 1 mg via INTRAVENOUS
  Filled 2011-06-17: qty 1

## 2011-06-17 MED ORDER — SODIUM CHLORIDE 0.9 % IV SOLN
INTRAVENOUS | Status: DC
  Administered 2011-06-17 – 2011-06-18 (×2): via INTRAVENOUS

## 2011-06-17 MED ORDER — ONDANSETRON HCL 4 MG/2ML IJ SOLN
4.0000 mg | Freq: Once | INTRAMUSCULAR | Status: AC
  Administered 2011-06-17: 4 mg via INTRAVENOUS
  Filled 2011-06-17: qty 2

## 2011-06-17 MED ORDER — SODIUM CHLORIDE 0.9 % IV SOLN: INTRAVENOUS | Status: DC

## 2011-06-17 MED ORDER — AMLODIPINE BESYLATE 5 MG PO TABS
10.0000 mg | ORAL_TABLET | Freq: Every day | ORAL | Status: DC
  Filled 2011-06-17: qty 2

## 2011-06-17 MED ORDER — INSULIN ASPART 100 UNIT/ML ~~LOC~~ SOLN: 0.0000 [IU] | Freq: Every day | SUBCUTANEOUS | Status: DC

## 2011-06-17 MED ORDER — OXYCODONE HCL 5 MG PO TABS
5.0000 mg | ORAL_TABLET | Freq: Four times a day (QID) | ORAL | Status: DC | PRN
  Administered 2011-06-17 – 2011-06-18 (×5): 5 mg via ORAL
  Filled 2011-06-17 (×5): qty 1

## 2011-06-17 MED ORDER — MOXIFLOXACIN HCL IN NACL 400 MG/250ML IV SOLN
400.0000 mg | INTRAVENOUS | Status: DC
  Administered 2011-06-18: 400 mg via INTRAVENOUS
  Filled 2011-06-17 (×3): qty 250

## 2011-06-17 MED ORDER — OXYCODONE-ACETAMINOPHEN 10-325 MG PO TABS: 1.0000 | ORAL_TABLET | Freq: Four times a day (QID) | ORAL | Status: DC | PRN

## 2011-06-17 MED ORDER — ACETAMINOPHEN 650 MG RE SUPP: 650.0000 mg | Freq: Four times a day (QID) | RECTAL | Status: DC | PRN

## 2011-06-17 MED ORDER — HYDROMORPHONE HCL PF 1 MG/ML IJ SOLN: 0.5000 mg | INTRAMUSCULAR | Status: DC | PRN

## 2011-06-17 MED ORDER — METHOCARBAMOL 500 MG PO TABS
500.0000 mg | ORAL_TABLET | Freq: Four times a day (QID) | ORAL | Status: DC
  Administered 2011-06-17 – 2011-06-19 (×11): 500 mg via ORAL
  Filled 2011-06-17 (×10): qty 1

## 2011-06-17 NOTE — Progress Notes (Signed)
Paged Dr. Karilyn Cota @ 1127 about px BP @ 1005 112/60, HR 74 and if want to administer Norvasc 10 mg since he received Imdur 30 mg and Lopressor 25 mg @ 0925 for BP of 149/67, HR 84.  Dr. Juliette Alcide the order for Norvasc 10 mg. Marton Redwood, RN, MSN

## 2011-06-17 NOTE — Progress Notes (Addendum)
1956 family complained of patient getting confused again and having some pain. I assessed patients pain at 8 and medicated shortly thereafter. VS are stable at this time. O2 is 97% on 3L [place]. At this time patient is only oriented to person. I notified Dr. Rito Ehrlich of patients altered mental status CT of head ordered. Shortly after approving CT order, radiology called to verify that the scan was to be done stat due to contrast being given recently for the chest CT. I spoke with Dr. Rito Ehrlich and he confirmed that the CT was to be done stat. [name]

## 2011-06-17 NOTE — Progress Notes (Signed)
ANTICOAGULATION CONSULT NOTE  Pharmacy Consult for Warfarin Indication: VTE prophylaxis  Allergies  Allergen Reactions  . Sulfa Antibiotics Rash    Was a salve    Patient Measurements: Height: 5\' 11"  (180.3 cm) Weight: 193 lb (87.544 kg) IBW/kg (Calculated) : 75.3    Vital Signs: Temp: 97.3 F (36.3 C) (01/19 0400) Temp src: Oral (01/19 0400) BP: 149/67 mmHg (01/19 0825) Pulse Rate: 84  (01/19 0825)  Labs:  Basename 06/17/11 0508 06/17/11 0012 06/16/11 0530 06/15/11 0645  HGB 8.2* 8.4* -- --  HCT 22.8* 23.3* 24.8* --  PLT 141* 137* 133* --  APTT -- -- -- --  LABPROT 18.2* 17.5* 17.9* --  INR 1.48 1.41 1.45 --  HEPARINUNFRC -- -- -- --  CREATININE 1.01 1.06 -- 1.01  CKTOTAL -- -- -- --  CKMB -- -- -- --  TROPONINI -- -- -- --   Estimated Creatinine Clearance: 65.2 ml/min (by C-G formula based on Cr of 1.01).   Medications:  Scheduled:    . docusate sodium  100 mg Oral BID  . enoxaparin  40 mg Subcutaneous Q24H  .  HYDROmorphone (DILAUDID) injection  1 mg Intravenous Once  . insulin aspart  0-15 Units Subcutaneous TID WC  . insulin aspart  0-5 Units Subcutaneous QHS  . isosorbide mononitrate  30 mg Oral Daily  . losartan  100 mg Oral Daily  . methocarbamol  500 mg Oral QID  . metoprolol tartrate  25 mg Oral BID  . ondansetron  4 mg Intravenous Once  . pantoprazole  40 mg Oral Q1200  . simvastatin  20 mg Oral QHS    Assessment: Okay for Protocol Recently discharge from [facility] after Left Hip Replacement.  Goal of Therapy:  INR 2-3   Plan:  Warfarin 7.5mg  PO x 1. Daily PT/INR.  [name] R 06/17/2011,8:53 AM
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
## 2011-06-17 NOTE — Progress Notes (Signed)
Subjective: This man was admitted with a fever and a left hip pain. He also had hyponatremia. He had been on thiazide diuretic and Cozaar for hypertension. This morning he feels well, there's been no fever. He has no cough, dyspnea. He has no urinary symptoms at the present time and urinalysis was unremarkable in the emergency room.           Physical Exam: Blood pressure 149/67, pulse 84, temperature 97.3 F (36.3 C), temperature source Oral, resp. rate 19, height 5\' 11"  (1.803 m), weight 87.544 kg (193 lb), SpO2 94.00%. He looks systemically well. Is not toxic or septic clinically. Heart sounds are present and normal. Lung fields are clear. There is no evidence of bronchial breathing, crackles or wheezing. He is alert and orientated. His abdomen is soft and nontender. The left hip surgery site does not look infected.   Investigations:  Recent Results (from the past 240 hour(s))  URINE CULTURE     Status: Normal   Collection Time   06/15/11 11:26 PM      Component Value Range Status Comment   Specimen Description URINE, CLEAN CATCH   Final    Special Requests NONE   Final    Setup Time [id number]   Final    Colony Count NO GROWTH   Final    Culture NO GROWTH   Final    Report Status 06/16/2011 FINAL   Final      Basic Metabolic Panel:  Basename 06/17/11 0508 06/17/11 0012  NA 123* 121*  K 4.1 4.0  CL 88* 86*  CO2 29 28  GLUCOSE 196* 186*  BUN 18 19  CREATININE 1.01 1.06  CALCIUM 8.8 8.7  MG -- --  PHOS -- --   Liver Function Tests:  [facility] 06/17/11 0508  AST 34  ALT 15  ALKPHOS 61  BILITOT 1.0  PROT 5.9*  ALBUMIN 2.6*     CBC:  Basename 06/17/11 0508 06/17/11 0012 06/16/11 0530  WBC 5.7 6.0 --  NEUTROABS -- -- 6.7  HGB 8.2* 8.4* --  HCT 22.8* 23.3* --  MCV 87.7 87.6 --  PLT 141* 137* --    Dg Chest 1 View  06/17/2011  *RADIOLOGY REPORT*  Clinical Data: Left hip replacement.  CHEST - 1 VIEW  Comparison: 06/15/2011  Findings: Prior CABG.  Mild  cardiomegaly.  Left basilar atelectasis or scarring, stable since prior study.  Right lung is clear.  No effusions.  No acute bony abnormality.  IMPRESSION: Cardiomegaly.  Left basilar atelectasis or scarring.  Original Report Authenticated By: Cyndie Chime, M.D.   Dg Hip Complete Left  06/17/2011  *RADIOLOGY REPORT*  Clinical Data: Recent left hip replacement.  Pain, fever.  LEFT HIP - COMPLETE 2+ VIEW  Comparison: 06/13/2011.  Findings: Changes of bilateral hip replacements.  Normal alignment. No hardware complicating feature.  No acute bony abnormality.  Soft tissue gas overlying the left hip.  IMPRESSION: Soft tissue gas within the lateral left hip, presumably postoperative.  No acute bony abnormality.  Original Report Authenticated By: Cyndie Chime, M.D.   Dg Chest Port 1 View  06/15/2011  *RADIOLOGY REPORT*  Clinical Data: Fever, COPD  PORTABLE CHEST - 1 VIEW  Comparison: 06/06/2011  Findings: Lungs are clear. No pleural effusion or pneumothorax.  Mild cardiomegaly. Postsurgical changes related to prior CABG.  IMPRESSION: No evidence of acute cardiopulmonary disease.  Mild cardiomegaly.  Original Report Authenticated By: Charline Bills, M.D.      Medications: I have reviewed  the patient's current medications.  Impression: 1. Hyponatremia, slowly improving. 2. Left hip pain, slight improvement. 3. Anemia, status post left hip fracture. 4. Diabetes mellitus. 5. Hypertension. 6. Coronary artery disease, stable.     Plan: 1. Discontinue Cozaar. Start Norvasc for hypertension. 2. Monitor hemoglobin and electrolytes.     LOS: 1 day   Wilson Singer Pager [phone]  06/17/2011, 9:54 AM
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
## 2011-06-17 NOTE — ED Notes (Signed)
Attempted to call report to floor. RN unavailable. Message left for return call.

## 2011-06-18 DIAGNOSIS — M25559 Pain in unspecified hip: Secondary | ICD-10-CM | POA: Diagnosis not present

## 2011-06-18 DIAGNOSIS — E871 Hypo-osmolality and hyponatremia: Secondary | ICD-10-CM | POA: Diagnosis not present

## 2011-06-18 DIAGNOSIS — D649 Anemia, unspecified: Secondary | ICD-10-CM | POA: Diagnosis not present

## 2011-06-18 LAB — COMPREHENSIVE METABOLIC PANEL
AST: 27 U/L (ref 0–37)
CO2: 26 mEq/L (ref 19–32)
Calcium: 8.5 mg/dL (ref 8.4–10.5)
Creatinine, Ser: 0.76 mg/dL (ref 0.50–1.35)
GFR calc Af Amer: >90 mL/min (ref >90)
GFR calc non Af Amer: 86 mL/min — ABNORMAL LOW (ref >90)
Glucose, Bld: 159 mg/dL — ABNORMAL HIGH (ref 70–99)

## 2011-06-18 LAB — PROTIME-INR: Prothrombin Time: 18.3 seconds — ABNORMAL HIGH (ref 11.6–15.2)

## 2011-06-18 LAB — TYPE AND SCREEN
Antibody Screen: NEGATIVE
Unit division: 0

## 2011-06-18 LAB — CARDIAC PANEL(CRET KIN+CKTOT+MB+TROPI)
Relative Index: 0.4 (ref 0.0–2.5)
Total CK: 596 U/L — ABNORMAL HIGH (ref 7–232)
Total CK: 717 U/L — ABNORMAL HIGH (ref 7–232)

## 2011-06-18 LAB — CBC
Hemoglobin: 7.7 g/dL — ABNORMAL LOW (ref 13.0–17.0)
MCH: 31.2 pg (ref 26.0–34.0)
MCHC: 35.8 g/dL (ref 30.0–36.0)

## 2011-06-18 LAB — GLUCOSE, CAPILLARY

## 2011-06-18 LAB — TSH: TSH: 2.251 u[IU]/mL (ref 0.350–4.500)

## 2011-06-18 LAB — PREPARE RBC (CROSSMATCH)

## 2011-06-18 MED ORDER — WARFARIN SODIUM 7.5 MG PO TABS
7.5000 mg | ORAL_TABLET | Freq: Once | ORAL | Status: AC
  Administered 2011-06-18: 7.5 mg via ORAL
  Filled 2011-06-18: qty 1

## 2011-06-18 MED ORDER — SODIUM CHLORIDE 0.9 % IJ SOLN
INTRAMUSCULAR | Status: AC
  Administered 2011-06-18
  Filled 2011-06-18: qty 3

## 2011-06-18 MED ORDER — MOXIFLOXACIN HCL IN NACL 400 MG/250ML IV SOLN
INTRAVENOUS | Status: AC
  Filled 2011-06-18: qty 250

## 2011-06-18 MED ORDER — SODIUM CHLORIDE 0.9 % IJ SOLN
INTRAMUSCULAR | Status: AC
  Administered 2011-06-18: 3 mL
  Filled 2011-06-18: qty 6

## 2011-06-18 NOTE — Progress Notes (Signed)
ANTICOAGULATION CONSULT NOTE  Pharmacy Consult for Warfarin Indication: VTE prophylaxis  Allergies  Allergen Reactions  . Sulfa Antibiotics Rash    Was a salve    Patient Measurements: Height: 5\' 11"  (180.3 cm) Weight: 193 lb (87.544 kg) IBW/kg (Calculated) : 75.3    Vital Signs: Temp: 98.7 F (37.1 C) (01/20 0431) Temp src: Oral (01/20 0431) BP: 138/71 mmHg (01/20 0431) Pulse Rate: 74  (01/20 0431)  Labs:  Dustin Vincent 06/18/11 0554 06/18/11 0016 06/17/11 1722 06/17/11 0508 06/17/11 0012  HGB 7.7* -- 8.3* -- --  HCT 21.5* -- 23.2* 22.8* --  PLT 152 -- 151 141* --  APTT -- -- -- -- --  LABPROT 18.3* -- -- 18.2* 17.5*  INR 1.49 -- -- 1.48 1.41  HEPARINUNFRC -- -- -- -- --  CREATININE 0.76 -- 0.91 1.01 --  CKTOTAL -- 717* 876* -- --  CKMB -- 2.4 2.6 -- --  TROPONINI -- <0.30 <0.30 -- --   Estimated Creatinine Clearance: 82.4 ml/min (by C-G formula based on Cr of 0.76).   Medications:  Scheduled:     . amLODipine  5 mg Oral Daily  . docusate sodium  100 mg Oral BID  . enoxaparin  40 mg Subcutaneous Q24H  . insulin aspart  0-15 Units Subcutaneous TID WC  . insulin aspart  0-5 Units Subcutaneous QHS  . isosorbide mononitrate  30 mg Oral Daily  . methocarbamol  500 mg Oral QID  . metoprolol tartrate  25 mg Oral BID  . moxifloxacin  400 mg Intravenous Q24H  . pantoprazole  40 mg Oral Q1200  . simvastatin  20 mg Oral QHS  . sodium chloride      . warfarin  7.5 mg Oral ONCE-1800  . DISCONTD: amLODipine  10 mg Oral Daily  . DISCONTD: losartan  100 mg Oral Daily    Assessment: Okay for Protocol Recently discharged from [facility] after Left Hip Replacement.  Hg = 7.7   Goal of Therapy:  INR 2-3   Plan:  Warfarin 7.5 mg PO x 1. Daily PT/INR.  Dustin Vincent 06/18/2011,8:32 AM

## 2011-06-18 NOTE — Progress Notes (Signed)
Subjective: This man was confused yesterday and hypoxic. CT angiogram of the chest was ordered with no evidence of pulmonary edema but there was evidence of bilateral lower lobe atelectasis versus infection. He also had a CT brain scan done which was possibly suggestive of an acute/subacute CVA but this morning he is significantly improved and back to his baseline. I do not believe he had a stroke and likely he had atelectasis causing hypoxemia.           Physical Exam: Blood pressure 138/71, pulse 74, temperature 98.7 F (37.1 C), temperature source Oral, resp. rate 20, height 5\' 11"  (1.803 m), weight 87.544 kg (193 lb), SpO2 92.00%. He looks systemically well. Is not toxic or septic clinically. Heart sounds are present and normal. Lung fields are clear. There is no evidence of bronchial breathing, crackles or wheezing. He is alert and orientated. His abdomen is soft and nontender. The left hip surgery site does not look infected.   Investigations:  Recent Results (from the past 240 hour(s))  URINE CULTURE     Status: Normal   Collection Time   06/15/11 11:26 PM      Component Value Range Status Comment   Specimen Description URINE, CLEAN CATCH   Final    Special Requests NONE   Final    Setup Time [id number]   Final    Colony Count NO GROWTH   Final    Culture NO GROWTH   Final    Report Status 06/16/2011 FINAL   Final      Basic Metabolic Panel:  Basename 06/18/11 0554 06/17/11 1722  NA 122* 120*  K 4.0 3.8  CL 89* 85*  CO2 26 28  GLUCOSE 159* 188*  BUN 12 16  CREATININE 0.76 0.91  CALCIUM 8.5 8.7  MG -- --  PHOS -- --   Liver Function Tests:  [facility] 06/18/11 0554 06/17/11 0508  AST 27 34  ALT 16 15  ALKPHOS 61 61  BILITOT 0.9 1.0  PROT 5.8* 5.9*  ALBUMIN 2.4* 2.6*     CBC:  Basename 06/18/11 0554 06/17/11 1722 06/16/11 0530  WBC 4.5 6.1 --  NEUTROABS -- -- 6.7  HGB 7.7* 8.3* --  HCT 21.5* 23.2* --  MCV 87.0 87.5 --  PLT 152 151 --    Dg  Chest 1 View  06/17/2011  *RADIOLOGY REPORT*  Clinical Data: Left hip replacement.  CHEST - 1 VIEW  Comparison: 06/15/2011  Findings: Prior CABG.  Mild cardiomegaly.  Left basilar atelectasis or scarring, stable since prior study.  Right lung is clear.  No effusions.  No acute bony abnormality.  IMPRESSION: Cardiomegaly.  Left basilar atelectasis or scarring.  Original Report Authenticated By: Cyndie Chime, M.D.   Dg Hip Complete Left  06/17/2011  *RADIOLOGY REPORT*  Clinical Data: Recent left hip replacement.  Pain, fever.  LEFT HIP - COMPLETE 2+ VIEW  Comparison: 06/13/2011.  Findings: Changes of bilateral hip replacements.  Normal alignment. No hardware complicating feature.  No acute bony abnormality.  Soft tissue gas overlying the left hip.  IMPRESSION: Soft tissue gas within the lateral left hip, presumably postoperative.  No acute bony abnormality.  Original Report Authenticated By: Cyndie Chime, M.D.   Ct Head Wo Contrast  06/17/2011  *RADIOLOGY REPORT*  Clinical Data: Altered mental status with weakness.  Contrast CT study earlier today.  CT HEAD WITHOUT CONTRAST  Technique:  Contiguous axial images were obtained from the base of the skull through the vertex without  contrast.  Comparison: None.  Findings: There is no evidence for acute infarction, intracranial hemorrhage, mass lesion, hydrocephalus, or extra-axial fluid.  Mild atrophy is present.  Moderate chronic microvascular ischemic changes are present in the periventricular and subcortical white matter.  There is an asymmetric hypodensity in the left basal ganglia which is of indeterminate age and could represent acute infarction.  There are no CT signs of proximal vascular thrombosis. Vascular calcification is present.  The calvarium is intact. Chronic sclerosis is seen in the left mastoid.  Right mastoid contains minimal  fluid.  There is no acute sinus fluid.  IMPRESSION: Atrophy and small vessel disease.  Indeterminate hypodensity left  basal ganglia could represent acute or subacute infarction.  Correlate clinically.  Original Report Authenticated By: Elsie Stain, M.D.   Ct Angio Chest W/cm &/or Wo Cm  06/17/2011  *RADIOLOGY REPORT*  Clinical Data: Hypoxic.  Status post hip surgery.  Short of breath.  CT ANGIOGRAPHY CHEST  Technique:  Multidetector CT imaging of the chest using the standard protocol during bolus administration of intravenous contrast. Multiplanar reconstructed images including MIPs were obtained and reviewed to evaluate the vascular anatomy.  Contrast: OMNIPAQUE IOHEXOL 350 MG/ML IV SOLN  Comparison: Chest radiograph 06/17/2011  Findings: This is a technically good examination of the pulmonary arterial tree.  Pulmonary arteries are normal in caliber.  No focal filling defects are identified in the pulmonary tree to suggest pulmonary embolism.  There is mild cardiomegaly, and there postsurgical changes of median sternotomy for CABG.  The thoracic aorta is normal in caliber contains scattered atherosclerotic calcification.  Negative for aortic dissection.  Negative for pleural or pericardial effusion.  Negative for lymphadenopathy.  The patient has mild centrilobular emphysema.  There are non- enhancing opacities in the dependent portions of both lower lobes, favored to be due to airspace disease rather than atelectasis.  The upper lung fields are normally aerated.  The trachea and mainstem bronchi are patent.  There is no pneumothorax.  Thoracic spine vertebral bodies are normal in height and alignment. No acute or suspicious abnormality of the bones.  The imaged portion of the upper abdomen demonstrates cholecystectomy and atherosclerotic calcification of the splenic artery.  IMPRESSION:  1.  Negative for pulmonary embolism. 2.  Consolidation (less likely atelectasis) in the dependent portion of both lower lobes.  Question the possibility of aspiration pneumonitis or pneumonia. 3.  Cardiomegaly and prior CABG. 4.  Mild  centrilobular emphysema.  Original Report Authenticated By: Britta Mccreedy, M.D.   US Venous Img Lower Unilateral Left  06/17/2011  *RADIOLOGY REPORT*  Clinical Data: Left lower extremity pain and swelling, recent surgery  LEFT LOWER EXTREMITY VENOUS DUPLEX ULTRASOUND  Technique:  Gray-scale sonography with graded compression, as well as color Doppler and duplex ultrasound were performed to evaluate the deep venous system of the lower extremity from the level of the common femoral vein through the popliteal and proximal calf veins. Spectral Doppler was utilized to evaluate flow at rest and with distal augmentation maneuvers.  Comparison:  None.  Findings:  Normal compressibility of the common femoral, superficial femoral, and popliteal veins is demonstrated, as well as the visualized proximal calf veins.  No filling defects to suggest DVT on grayscale or color Doppler imaging.  Doppler waveforms show normal direction of venous flow, normal respiratory phasicity and response to augmentation.  IMPRESSION: No evidence of lower extremity deep vein thrombosis.  Original Report Authenticated By: Harrel Lemon, M.D.      Medications:  I have reviewed the patient's current medications.  Impression: 1. Hyponatremia, slowly improving. 2. Left hip pain, slight improvement. 3. Anemia, status post left hip fracture, now hemoglobin 7.7. 4. Diabetes mellitus. 5. Hypertension. 6. Coronary artery disease, stable. 7. Confusion, improved/resolved.     Plan: 1. Give 2 units of blood. 2. Encourage mobilization and I have instructed him on deep breathing exercises. 3. Physical therapy.     LOS: 2 days   Wilson Singer Pager [phone]  06/18/2011, 8:40 AM

## 2011-06-19 ENCOUNTER — Inpatient Hospital Stay
Admission: RE | Admit: 2011-06-19 | Discharge: 2011-07-03 | Disposition: A | Discharge status: Home / Self Care | Payer: Self-pay | Source: Ambulatory Visit | Attending: Internal Medicine | Admitting: Internal Medicine

## 2011-06-19 DIAGNOSIS — IMO0001 Reserved for inherently not codable concepts without codable children: Secondary | ICD-10-CM | POA: Diagnosis not present

## 2011-06-19 DIAGNOSIS — R262 Difficulty in walking, not elsewhere classified: Secondary | ICD-10-CM | POA: Diagnosis not present

## 2011-06-19 DIAGNOSIS — M7989 Other specified soft tissue disorders: Secondary | ICD-10-CM | POA: Diagnosis not present

## 2011-06-19 DIAGNOSIS — Z96649 Presence of unspecified artificial hip joint: Secondary | ICD-10-CM | POA: Diagnosis not present

## 2011-06-19 DIAGNOSIS — J9819 Other pulmonary collapse: Secondary | ICD-10-CM | POA: Diagnosis not present

## 2011-06-19 DIAGNOSIS — M25559 Pain in unspecified hip: Secondary | ICD-10-CM | POA: Diagnosis not present

## 2011-06-19 DIAGNOSIS — M169 Osteoarthritis of hip, unspecified: Secondary | ICD-10-CM | POA: Diagnosis not present

## 2011-06-19 DIAGNOSIS — D649 Anemia, unspecified: Secondary | ICD-10-CM | POA: Diagnosis not present

## 2011-06-19 DIAGNOSIS — E871 Hypo-osmolality and hyponatremia: Secondary | ICD-10-CM | POA: Diagnosis not present

## 2011-06-19 DIAGNOSIS — Z5189 Encounter for other specified aftercare: Secondary | ICD-10-CM | POA: Diagnosis not present

## 2011-06-19 DIAGNOSIS — M79606 Pain in leg, unspecified: Secondary | ICD-10-CM

## 2011-06-19 DIAGNOSIS — E119 Type 2 diabetes mellitus without complications: Secondary | ICD-10-CM | POA: Diagnosis not present

## 2011-06-19 DIAGNOSIS — I10 Essential (primary) hypertension: Secondary | ICD-10-CM | POA: Diagnosis not present

## 2011-06-19 DIAGNOSIS — R609 Edema, unspecified: Secondary | ICD-10-CM | POA: Diagnosis not present

## 2011-06-19 DIAGNOSIS — I80299 Phlebitis and thrombophlebitis of other deep vessels of unspecified lower extremity: Secondary | ICD-10-CM | POA: Diagnosis not present

## 2011-06-19 DIAGNOSIS — I2510 Atherosclerotic heart disease of native coronary artery without angina pectoris: Secondary | ICD-10-CM | POA: Diagnosis not present

## 2011-06-19 LAB — CBC
HCT: 28.4 % — ABNORMAL LOW (ref 39.0–52.0)
Hemoglobin: 10.2 g/dL — ABNORMAL LOW (ref 13.0–17.0)
MCH: 30.4 pg (ref 26.0–34.0)
MCV: 85.5 fL (ref 78.0–100.0)
RBC: 3.32 MIL/uL — ABNORMAL LOW (ref 4.22–5.81)
WBC: 4.4 10*3/uL (ref 4.0–10.5)

## 2011-06-19 LAB — COMPREHENSIVE METABOLIC PANEL
CO2: 27 mEq/L (ref 19–32)
Calcium: 8.7 mg/dL (ref 8.4–10.5)
Chloride: 91 mEq/L — ABNORMAL LOW (ref 96–112)
Creatinine, Ser: 0.77 mg/dL (ref 0.50–1.35)
GFR calc Af Amer: >90 mL/min (ref >90)
GFR calc non Af Amer: 85 mL/min — ABNORMAL LOW (ref >90)
Glucose, Bld: 176 mg/dL — ABNORMAL HIGH (ref 70–99)
Total Bilirubin: 1.4 mg/dL — ABNORMAL HIGH (ref 0.3–1.2)

## 2011-06-19 LAB — GLUCOSE, CAPILLARY: Glucose-Capillary: 191 mg/dL — ABNORMAL HIGH (ref 70–99)

## 2011-06-19 MED ORDER — MOXIFLOXACIN HCL 400 MG PO TABS: 400.0000 mg | ORAL_TABLET | Freq: Every day | ORAL | Status: AC

## 2011-06-19 MED ORDER — WARFARIN SODIUM 7.5 MG PO TABS: 7.5000 mg | ORAL_TABLET | Freq: Once | ORAL | Status: DC

## 2011-06-19 MED ORDER — AMLODIPINE BESYLATE 5 MG PO TABS: 5.0000 mg | ORAL_TABLET | Freq: Every day | ORAL | Status: DC

## 2011-06-19 NOTE — Progress Notes (Signed)
Inpatient Diabetes Program Recommendations  AACE/ADA: New Consensus Statement on Inpatient Glycemic Control (2009)  Target Ranges:  Prepandial:   less than 140 mg/dL      Peak postprandial:   less than 180 mg/dL (1-2 hours)      Critically ill patients:  140 - 180 mg/dL   Reason for Visit: Elevated fasting glucose: 191 mg/dL  Inpatient Diabetes Program Recommendations Insulin - Basal: Add Low dose Lantus:  15 units daily

## 2011-06-19 NOTE — Progress Notes (Signed)
After assessing heart sounds, I was concerned that I may have heard a very slight irregularity, so slight that I was unsure, overall very regular. I notified Dr. Rito Ehrlich last night upon receiving my telemetry strips, that the patient was having some PVCs. I looked back, and it appears that this was not new to my shift. He said to allow Dr. Karilyn Cota to assess in am since the patient is asymptomatic. I showed the strips to Dr. Karilyn Cota this am where I noticed then that the QRS was wide, along with some PVCs. Dr. Karilyn Cota said that he would look into it. [name]

## 2011-06-19 NOTE — Progress Notes (Signed)
Pt d/c today by MD back to [facility].  Pt, family, and facility are aware and agreeable.  Pt to transfer with RN. No FL2 needed per facility. D/C summary faxed.  Karn Cassis

## 2011-06-19 NOTE — Progress Notes (Signed)
UR Chart Review Completed  

## 2011-06-19 NOTE — Discharge Summary (Signed)
Physician Discharge Summary  Patient ID: JOHNPAUL GILLENTINE MRN: [id number] DOB/AGE: 12-03-1931 78 y.o.  Admit date: 06/16/2011 Discharge date: 06/19/2011    Discharge Diagnoses:  1. Hyponatremia, improving. Etiology unclear likely related to use of thiazide diuretic and ARB. 2. Symptomatic blood loss anemia, status post left hip fracture/surgery, status post 2 units blood transfusion. 3. Bilateral lower lobe atelectasis, clinically improved. 4. Hypertension. 5. Coronary artery disease, stable. 6. Diabetes mellitus.   Current Discharge Medication List    START taking these medications   Details  amLODipine (NORVASC) 5 MG tablet Take 1 tablet (5 mg total) by mouth daily.    moxifloxacin (AVELOX) 400 MG tablet Take 1 tablet (400 mg total) by mouth daily. Qty: 5 tablet, Refills: 0      CONTINUE these medications which have NOT CHANGED   Details  acetaminophen (TYLENOL) 325 MG tablet Take 650 mg by mouth every 4 (four) hours as needed. Fever, only give 1 tablet if given with Percocet    enoxaparin (LOVENOX) 40 MG/0.4ML SOLN Inject 0.4 mLs (40 mg total) into the skin daily. Qty: 5 Syringe, Refills: 0    isosorbide mononitrate (IMDUR) 30 MG 24 hr tablet Take 30 mg by mouth daily.      metFORMIN (GLUCOPHAGE) 1000 MG tablet Take 1,000 mg by mouth 2 (two) times daily with a meal.    methocarbamol (ROBAXIN) 500 MG tablet Take 500 mg by mouth 4 (four) times daily as needed. For muscle spasms    metoprolol tartrate (LOPRESSOR) 25 MG tablet Take 25 mg by mouth 2 (two) times daily.      omeprazole (PRILOSEC) 20 MG capsule Take 20 mg by mouth 2 (two) times daily.      oxyCODONE-acetaminophen (PERCOCET) 10-325 MG per tablet Take 1-2 tablets by mouth every 6 (six) hours as needed for pain. MAXIMUM TOTAL ACETAMINOPHEN DOSE IS 4000 MG PER DAY Qty: 75 tablet, Refills: 0    simvastatin (ZOCOR) 20 MG tablet Take 20 mg by mouth every evening.    terazosin (HYTRIN) 10 MG capsule Take 10 mg by  mouth at bedtime.      warfarin (COUMADIN) 5 MG tablet Take 1 tablet (5 mg total) by mouth daily. Qty: 30 tablet, Refills: 1      STOP taking these medications     hydrochlorothiazide (HYDRODIURIL) 25 MG tablet      losartan (COZAAR) 100 MG tablet         Discharged Condition: Stable and improved.    Consults: None.  Significant Diagnostic Studies: Dg Chest 1 View  06/17/2011  *RADIOLOGY REPORT*  Clinical Data: Left hip replacement.  CHEST - 1 VIEW  Comparison: 06/15/2011  Findings: Prior CABG.  Mild cardiomegaly.  Left basilar atelectasis or scarring, stable since prior study.  Right lung is clear.  No effusions.  No acute bony abnormality.  IMPRESSION: Cardiomegaly.  Left basilar atelectasis or scarring.  Original Report Authenticated By: Cyndie Chime, M.D.   Dg Chest 2 View  06/06/2011  *RADIOLOGY REPORT*  Clinical Data: Preop left hip arthroplasty  CHEST - 2 VIEW  Comparison: 10/19/2007  Findings: Prior CABG.  Heart size is upper normal.  Negative for heart failure.  Negative for mass or infiltrate.  Underlying COPD is present.  IMPRESSION: COPD.  No acute cardiopulmonary disease.  Original Report Authenticated By: Camelia Phenes, M.D.   Dg Hip Complete Left  06/17/2011  *RADIOLOGY REPORT*  Clinical Data: Recent left hip replacement.  Pain, fever.  LEFT HIP - COMPLETE 2+  VIEW  Comparison: 06/13/2011.  Findings: Changes of bilateral hip replacements.  Normal alignment. No hardware complicating feature.  No acute bony abnormality.  Soft tissue gas overlying the left hip.  IMPRESSION: Soft tissue gas within the lateral left hip, presumably postoperative.  No acute bony abnormality.  Original Report Authenticated By: Cyndie Chime, M.D.   Ct Head Wo Contrast  06/17/2011  *RADIOLOGY REPORT*  Clinical Data: Altered mental status with weakness.  Contrast CT study earlier today.  CT HEAD WITHOUT CONTRAST  Technique:  Contiguous axial images were obtained from the base of the skull  through the vertex without contrast.  Comparison: None.  Findings: There is no evidence for acute infarction, intracranial hemorrhage, mass lesion, hydrocephalus, or extra-axial fluid.  Mild atrophy is present.  Moderate chronic microvascular ischemic changes are present in the periventricular and subcortical white matter.  There is an asymmetric hypodensity in the left basal ganglia which is of indeterminate age and could represent acute infarction.  There are no CT signs of proximal vascular thrombosis. Vascular calcification is present.  The calvarium is intact. Chronic sclerosis is seen in the left mastoid.  Right mastoid contains minimal  fluid.  There is no acute sinus fluid.  IMPRESSION: Atrophy and small vessel disease.  Indeterminate hypodensity left basal ganglia could represent acute or subacute infarction.  Correlate clinically.  Original Report Authenticated By: Elsie Stain, M.D.   Ct Angio Chest W/cm &/or Wo Cm  06/17/2011  *RADIOLOGY REPORT*  Clinical Data: Hypoxic.  Status post hip surgery.  Short of breath.  CT ANGIOGRAPHY CHEST  Technique:  Multidetector CT imaging of the chest using the standard protocol during bolus administration of intravenous contrast. Multiplanar reconstructed images including MIPs were obtained and reviewed to evaluate the vascular anatomy.  Contrast: OMNIPAQUE IOHEXOL 350 MG/ML IV SOLN  Comparison: Chest radiograph 06/17/2011  Findings: This is a technically good examination of the pulmonary arterial tree.  Pulmonary arteries are normal in caliber.  No focal filling defects are identified in the pulmonary tree to suggest pulmonary embolism.  There is mild cardiomegaly, and there postsurgical changes of median sternotomy for CABG.  The thoracic aorta is normal in caliber contains scattered atherosclerotic calcification.  Negative for aortic dissection.  Negative for pleural or pericardial effusion.  Negative for lymphadenopathy.  The patient has mild  centrilobular emphysema.  There are non- enhancing opacities in the dependent portions of both lower lobes, favored to be due to airspace disease rather than atelectasis.  The upper lung fields are normally aerated.  The trachea and mainstem bronchi are patent.  There is no pneumothorax.  Thoracic spine vertebral bodies are normal in height and alignment. No acute or suspicious abnormality of the bones.  The imaged portion of the upper abdomen demonstrates cholecystectomy and atherosclerotic calcification of the splenic artery.  IMPRESSION:  1.  Negative for pulmonary embolism. 2.  Consolidation (less likely atelectasis) in the dependent portion of both lower lobes.  Question the possibility of aspiration pneumonitis or pneumonia. 3.  Cardiomegaly and prior CABG. 4.  Mild centrilobular emphysema.  Original Report Authenticated By: Britta Mccreedy, M.D.   Dg Pelvis Portable  06/13/2011  *RADIOLOGY REPORT*  Clinical Data: Postop left hip replacement.  PORTABLE PELVIS  Comparison: Lateral view performed same time.  Findings: Left total hip replacements appears in satisfactory position.  Lucency of the proximal to mid left femoral shaft may represent overlapping structure rather than fracture and can be assessed on follow-up.  Remote right hip replacement  with right acetabular screw entering the pelvis.  Vascular calcifications.  IMPRESSION: Left total hip replacements appears in satisfactory position. Lucency of the proximal to mid left femoral shaft may represent overlapping structure rather than fracture and can be assessed on follow-up.  Remote right hip replacement with right acetabular screw entering the pelvis.  Vascular calcifications.  Original Report Authenticated By: Fuller Canada, M.D.   US Venous Img Lower Unilateral Left  06/17/2011  *RADIOLOGY REPORT*  Clinical Data: Left lower extremity pain and swelling, recent surgery  LEFT LOWER EXTREMITY VENOUS DUPLEX ULTRASOUND  Technique:  Gray-scale  sonography with graded compression, as well as color Doppler and duplex ultrasound were performed to evaluate the deep venous system of the lower extremity from the level of the common femoral vein through the popliteal and proximal calf veins. Spectral Doppler was utilized to evaluate flow at rest and with distal augmentation maneuvers.  Comparison:  None.  Findings:  Normal compressibility of the common femoral, superficial femoral, and popliteal veins is demonstrated, as well as the visualized proximal calf veins.  No filling defects to suggest DVT on grayscale or color Doppler imaging.  Doppler waveforms show normal direction of venous flow, normal respiratory phasicity and response to augmentation.  IMPRESSION: No evidence of lower extremity deep vein thrombosis.  Original Report Authenticated By: Harrel Lemon, M.D.   Dg Chest Port 1 View  06/15/2011  *RADIOLOGY REPORT*  Clinical Data: Fever, COPD  PORTABLE CHEST - 1 VIEW  Comparison: 06/06/2011  Findings: Lungs are clear. No pleural effusion or pneumothorax.  Mild cardiomegaly. Postsurgical changes related to prior CABG.  IMPRESSION: No evidence of acute cardiopulmonary disease.  Mild cardiomegaly.  Original Report Authenticated By: Charline Bills, M.D.   Dg Hip Portable 1 View Left  06/13/2011  *RADIOLOGY REPORT*  Clinical Data: Postop left hip  PORTABLE LEFT HIP - 1 VIEW  Comparison: Pelvis radiograph 06/13/2011  Findings: Cross-table lateral view of the left hip is performed. The left hip arthroplasty is partially obscured by the patient's right hip arthroplasty.  The femoral head component appears located within the acetabular component.  No periprosthetic fracture is identified.  IMPRESSION: Left hip arthroplasty appears located.  No complicating feature identified.  Original Report Authenticated By: Britta Mccreedy, M.D.    Lab Results: Basic Metabolic Panel:  Basename 06/19/11 0459 06/18/11 0554  NA 125* 122*  K 3.9 4.0  CL 91* 89*    CO2 27 26  GLUCOSE 176* 159*  BUN 11 12  CREATININE 0.77 0.76  CALCIUM 8.7 8.5  MG -- --  PHOS -- --   Liver Function Tests:  Basename 06/19/11 0459 06/18/11 0554  AST 29 27  ALT 20 16  ALKPHOS 69 61  BILITOT 1.4* 0.9  PROT 5.9* 5.8*  ALBUMIN 2.5* 2.4*     CBC:  Basename 06/19/11 0459 06/18/11 0554  WBC 4.4 4.5  NEUTROABS -- --  HGB 10.2* 7.7*  HCT 28.4* 21.5*  MCV 85.5 87.0  PLT 169 152    Recent Results (from the past 240 hour(s))  URINE CULTURE     Status: Normal   Collection Time   06/15/11 11:26 PM      Component Value Range Status Comment   Specimen Description URINE, CLEAN CATCH   Final    Special Requests NONE   Final    Setup Time [id number]   Final    Colony Count NO GROWTH   Final    Culture NO GROWTH   Final  Report Status 06/16/2011 FINAL   Final      Hospital Course: This very pleasant 78 year old man was admitted with left hip pain and also had a fever associated. He also had significant hyponatremia with sodium 120 on admission. It was felt that the etiology of the hyponatremia was unclear but the use of thiazide diuretics and Cozaar may well applied apart. He was initially also felt to be hypovolemic and he was treated with normal saline intravenously. This did improve matters. He also was found to be significantly anemic and his nadir was hemoglobin of 7.7. The likely cause of the anemia was probably acute blood loss related from his left hip fracture and surgery. He was therefore transfused 2 units of blood. He feels much improved today and is more back to his usual self. A CT angiogram of the chest had been done in view of his hypoxia which was temporary and there was no evidence of pulmonary embolism but evidence of atelectasis. Nonetheless he has been treated with a course of antibiotics in case there was a degree of infection involved also. His sodium levels have improved and today it is 125. I suspect this will continue to improve with  time.  Discharge Exam: Blood pressure 153/74, pulse 73, temperature 98.4 F (36.9 C), temperature source Oral, resp. rate 18, height 5\' 11"  (1.803 m), weight 87.544 kg (193 lb), SpO2 96.00%. He looks systemically very well. He is alert and orientated. Is not toxic clinically. Heart sounds are present and normal without murmurs. Lung fields are entirely clear with no evidence of bronchial breathing, crackles or wheezing. He is not clinically in heart failure. He is alert and orientated without any focal neurologic signs. There is no evidence of infection at the left hip wound site.  Disposition: Skilled nursing facility at the [facility].He will continue with rehabilitation there. His electrolytes need to be checked, in particular paying attention to his sodium levels.  Discharge Orders    Future Orders Please Complete By Expires   Diet - low sodium heart healthy      Increase activity slowly           SignedWilson Singer Pager [phone]  06/19/2011, 11:27 AM
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
## 2011-06-19 NOTE — Progress Notes (Signed)
ANTICOAGULATION CONSULT NOTE  Pharmacy Consult for Warfarin Indication: VTE prophylaxis  Allergies  Allergen Reactions  . Sulfa Antibiotics Rash    Was Vincent salve   Patient Measurements: Height: 5\' 11"  (180.3 cm) Weight: 193 lb (87.544 kg) IBW/kg (Calculated) : 75.3   Vital Signs: Temp: 98.4 F (36.9 C) (01/21 0523) Temp src: Oral (01/21 0523) BP: 153/74 mmHg (01/21 0523) Pulse Rate: 73  (01/21 0523)  Labs:  Dustin Vincent 06/19/11 0459 06/18/11 0910 06/18/11 0554 06/18/11 0016 06/17/11 1722 06/17/11 0508  HGB 10.2* -- 7.7* -- -- --  HCT 28.4* -- 21.5* -- 23.2* --  PLT 169 -- 152 -- 151 --  APTT -- -- -- -- -- --  LABPROT 21.0* -- 18.3* -- -- 18.2*  INR 1.78* -- 1.49 -- -- 1.48  HEPARINUNFRC -- -- -- -- -- --  CREATININE 0.77 -- 0.76 -- 0.91 --  CKTOTAL -- 596* -- 717* 876* --  CKMB -- 2.3 -- 2.4 2.6 --  TROPONINI -- <0.30 -- <0.30 <0.30 --   Estimated Creatinine Clearance: 82.4 ml/min (by C-G formula based on Cr of 0.77).  Medications:  Scheduled:     . amLODipine  5 mg Oral Daily  . docusate sodium  100 mg Oral BID  . enoxaparin  40 mg Subcutaneous Q24H  . insulin aspart  0-15 Units Subcutaneous TID WC  . insulin aspart  0-5 Units Subcutaneous QHS  . isosorbide mononitrate  30 mg Oral Daily  . methocarbamol  500 mg Oral QID  . metoprolol tartrate  25 mg Oral BID  . moxifloxacin  400 mg Intravenous Q24H  . pantoprazole  40 mg Oral Q1200  . simvastatin  20 mg Oral QHS  . sodium chloride      . sodium chloride      . warfarin  7.5 mg Oral ONCE-1800  . warfarin  7.5 mg Oral ONCE-1800    Assessment: INR rising Okay for Protocol Recently discharged from [facility] after Left Hip Replacement.  Goal of Therapy:  INR 2-3   Plan:  Warfarin 7.5 mg PO x 1. Daily PT/INR.  Dustin Vincent, Dustin Vincent 06/19/2011,7:57 AM

## 2011-06-20 LAB — GLUCOSE, CAPILLARY
Glucose-Capillary: 194 mg/dL — ABNORMAL HIGH (ref 70–99)
Glucose-Capillary: 217 mg/dL — ABNORMAL HIGH (ref 70–99)
Glucose-Capillary: 218 mg/dL — ABNORMAL HIGH (ref 70–99)
Glucose-Capillary: 179 mg/dL — ABNORMAL HIGH (ref 70–99)

## 2011-06-21 DIAGNOSIS — I80299 Phlebitis and thrombophlebitis of other deep vessels of unspecified lower extremity: Secondary | ICD-10-CM | POA: Diagnosis not present

## 2011-06-21 DIAGNOSIS — R609 Edema, unspecified: Secondary | ICD-10-CM | POA: Diagnosis not present

## 2011-06-21 DIAGNOSIS — I2510 Atherosclerotic heart disease of native coronary artery without angina pectoris: Secondary | ICD-10-CM | POA: Diagnosis not present

## 2011-06-21 DIAGNOSIS — M169 Osteoarthritis of hip, unspecified: Secondary | ICD-10-CM | POA: Diagnosis not present

## 2011-06-21 LAB — GLUCOSE, CAPILLARY: Glucose-Capillary: 227 mg/dL — ABNORMAL HIGH (ref 70–99)

## 2011-06-22 LAB — GLUCOSE, CAPILLARY
Glucose-Capillary: 162 mg/dL — ABNORMAL HIGH (ref 70–99)
Glucose-Capillary: 211 mg/dL — ABNORMAL HIGH (ref 70–99)
Glucose-Capillary: 196 mg/dL — ABNORMAL HIGH (ref 70–99)

## 2011-06-23 LAB — GLUCOSE, CAPILLARY

## 2011-06-24 LAB — GLUCOSE, CAPILLARY
Glucose-Capillary: 152 mg/dL — ABNORMAL HIGH (ref 70–99)
Glucose-Capillary: 133 mg/dL — ABNORMAL HIGH (ref 70–99)
Glucose-Capillary: 191 mg/dL — ABNORMAL HIGH (ref 70–99)

## 2011-06-25 LAB — GLUCOSE, CAPILLARY
Glucose-Capillary: 141 mg/dL — ABNORMAL HIGH (ref 70–99)
Glucose-Capillary: 190 mg/dL — ABNORMAL HIGH (ref 70–99)

## 2011-06-26 LAB — GLUCOSE, CAPILLARY

## 2011-06-27 LAB — GLUCOSE, CAPILLARY
Glucose-Capillary: 149 mg/dL — ABNORMAL HIGH (ref 70–99)
Glucose-Capillary: 152 mg/dL — ABNORMAL HIGH (ref 70–99)
Glucose-Capillary: 156 mg/dL — ABNORMAL HIGH (ref 70–99)
Glucose-Capillary: 173 mg/dL — ABNORMAL HIGH (ref 70–99)

## 2011-06-28 ENCOUNTER — Ambulatory Visit (HOSPITAL_COMMUNITY): Payer: Medicare Other | Attending: Internal Medicine

## 2011-06-28 DIAGNOSIS — Z96649 Presence of unspecified artificial hip joint: Secondary | ICD-10-CM | POA: Insufficient documentation

## 2011-06-28 DIAGNOSIS — Z9889 Other specified postprocedural states: Secondary | ICD-10-CM | POA: Insufficient documentation

## 2011-06-28 DIAGNOSIS — M7989 Other specified soft tissue disorders: Secondary | ICD-10-CM | POA: Insufficient documentation

## 2011-06-28 DIAGNOSIS — I2510 Atherosclerotic heart disease of native coronary artery without angina pectoris: Secondary | ICD-10-CM | POA: Diagnosis not present

## 2011-06-28 DIAGNOSIS — R609 Edema, unspecified: Secondary | ICD-10-CM | POA: Diagnosis not present

## 2011-06-28 DIAGNOSIS — I80299 Phlebitis and thrombophlebitis of other deep vessels of unspecified lower extremity: Secondary | ICD-10-CM | POA: Diagnosis not present

## 2011-06-28 LAB — GLUCOSE, CAPILLARY: Glucose-Capillary: 165 mg/dL — ABNORMAL HIGH (ref 70–99)

## 2011-06-29 LAB — GLUCOSE, CAPILLARY
Glucose-Capillary: 159 mg/dL — ABNORMAL HIGH (ref 70–99)
Glucose-Capillary: 192 mg/dL — ABNORMAL HIGH (ref 70–99)
Glucose-Capillary: 164 mg/dL — ABNORMAL HIGH (ref 70–99)

## 2011-06-30 ENCOUNTER — Ambulatory Visit (HOSPITAL_COMMUNITY): Payer: Medicare Other | Attending: Internal Medicine

## 2011-06-30 DIAGNOSIS — M7989 Other specified soft tissue disorders: Secondary | ICD-10-CM | POA: Insufficient documentation

## 2011-06-30 DIAGNOSIS — R609 Edema, unspecified: Secondary | ICD-10-CM | POA: Diagnosis not present

## 2011-06-30 DIAGNOSIS — E119 Type 2 diabetes mellitus without complications: Secondary | ICD-10-CM | POA: Insufficient documentation

## 2011-06-30 LAB — GLUCOSE, CAPILLARY
Glucose-Capillary: 137 mg/dL — ABNORMAL HIGH (ref 70–99)
Glucose-Capillary: 141 mg/dL — ABNORMAL HIGH (ref 70–99)

## 2011-07-02 LAB — GLUCOSE, CAPILLARY
Glucose-Capillary: 135 mg/dL — ABNORMAL HIGH (ref 70–99)
Glucose-Capillary: 131 mg/dL — ABNORMAL HIGH (ref 70–99)
Glucose-Capillary: 180 mg/dL — ABNORMAL HIGH (ref 70–99)
Glucose-Capillary: 170 mg/dL — ABNORMAL HIGH (ref 70–99)

## 2011-07-03 DIAGNOSIS — I80299 Phlebitis and thrombophlebitis of other deep vessels of unspecified lower extremity: Secondary | ICD-10-CM | POA: Diagnosis not present

## 2011-07-03 LAB — GLUCOSE, CAPILLARY: Glucose-Capillary: 143 mg/dL — ABNORMAL HIGH (ref 70–99)

## 2011-07-04 ENCOUNTER — Telehealth: Payer: Self-pay | Admitting: Orthopedic Surgery

## 2011-07-05 DIAGNOSIS — I10 Essential (primary) hypertension: Secondary | ICD-10-CM | POA: Diagnosis not present

## 2011-07-05 DIAGNOSIS — Z96649 Presence of unspecified artificial hip joint: Secondary | ICD-10-CM | POA: Diagnosis not present

## 2011-07-05 DIAGNOSIS — E119 Type 2 diabetes mellitus without complications: Secondary | ICD-10-CM | POA: Diagnosis not present

## 2011-07-05 DIAGNOSIS — I2510 Atherosclerotic heart disease of native coronary artery without angina pectoris: Secondary | ICD-10-CM | POA: Diagnosis not present

## 2011-07-05 DIAGNOSIS — IMO0001 Reserved for inherently not codable concepts without codable children: Secondary | ICD-10-CM | POA: Diagnosis not present

## 2011-07-05 DIAGNOSIS — Z471 Aftercare following joint replacement surgery: Secondary | ICD-10-CM | POA: Diagnosis not present

## 2011-07-07 DIAGNOSIS — I2510 Atherosclerotic heart disease of native coronary artery without angina pectoris: Secondary | ICD-10-CM | POA: Diagnosis not present

## 2011-07-07 DIAGNOSIS — E119 Type 2 diabetes mellitus without complications: Secondary | ICD-10-CM | POA: Diagnosis not present

## 2011-07-07 DIAGNOSIS — Z471 Aftercare following joint replacement surgery: Secondary | ICD-10-CM | POA: Diagnosis not present

## 2011-07-07 DIAGNOSIS — I10 Essential (primary) hypertension: Secondary | ICD-10-CM | POA: Diagnosis not present

## 2011-07-07 DIAGNOSIS — IMO0001 Reserved for inherently not codable concepts without codable children: Secondary | ICD-10-CM | POA: Diagnosis not present

## 2011-07-07 DIAGNOSIS — Z96649 Presence of unspecified artificial hip joint: Secondary | ICD-10-CM | POA: Diagnosis not present

## 2011-07-10 DIAGNOSIS — IMO0001 Reserved for inherently not codable concepts without codable children: Secondary | ICD-10-CM | POA: Diagnosis not present

## 2011-07-10 DIAGNOSIS — Z471 Aftercare following joint replacement surgery: Secondary | ICD-10-CM | POA: Diagnosis not present

## 2011-07-10 DIAGNOSIS — I10 Essential (primary) hypertension: Secondary | ICD-10-CM | POA: Diagnosis not present

## 2011-07-10 DIAGNOSIS — I2510 Atherosclerotic heart disease of native coronary artery without angina pectoris: Secondary | ICD-10-CM | POA: Diagnosis not present

## 2011-07-10 DIAGNOSIS — Z96649 Presence of unspecified artificial hip joint: Secondary | ICD-10-CM | POA: Diagnosis not present

## 2011-07-10 DIAGNOSIS — E119 Type 2 diabetes mellitus without complications: Secondary | ICD-10-CM | POA: Diagnosis not present

## 2011-07-12 DIAGNOSIS — Z471 Aftercare following joint replacement surgery: Secondary | ICD-10-CM | POA: Diagnosis not present

## 2011-07-12 DIAGNOSIS — I10 Essential (primary) hypertension: Secondary | ICD-10-CM | POA: Diagnosis not present

## 2011-07-12 DIAGNOSIS — E119 Type 2 diabetes mellitus without complications: Secondary | ICD-10-CM | POA: Diagnosis not present

## 2011-07-12 DIAGNOSIS — I2510 Atherosclerotic heart disease of native coronary artery without angina pectoris: Secondary | ICD-10-CM | POA: Diagnosis not present

## 2011-07-12 DIAGNOSIS — Z96649 Presence of unspecified artificial hip joint: Secondary | ICD-10-CM | POA: Diagnosis not present

## 2011-07-12 DIAGNOSIS — IMO0001 Reserved for inherently not codable concepts without codable children: Secondary | ICD-10-CM | POA: Diagnosis not present

## 2011-07-14 DIAGNOSIS — E119 Type 2 diabetes mellitus without complications: Secondary | ICD-10-CM | POA: Diagnosis not present

## 2011-07-14 DIAGNOSIS — I10 Essential (primary) hypertension: Secondary | ICD-10-CM | POA: Diagnosis not present

## 2011-07-14 DIAGNOSIS — Z471 Aftercare following joint replacement surgery: Secondary | ICD-10-CM | POA: Diagnosis not present

## 2011-07-14 DIAGNOSIS — IMO0001 Reserved for inherently not codable concepts without codable children: Secondary | ICD-10-CM | POA: Diagnosis not present

## 2011-07-14 DIAGNOSIS — Z96649 Presence of unspecified artificial hip joint: Secondary | ICD-10-CM | POA: Diagnosis not present

## 2011-07-14 DIAGNOSIS — I2510 Atherosclerotic heart disease of native coronary artery without angina pectoris: Secondary | ICD-10-CM | POA: Diagnosis not present

## 2011-07-17 DIAGNOSIS — I2510 Atherosclerotic heart disease of native coronary artery without angina pectoris: Secondary | ICD-10-CM | POA: Diagnosis not present

## 2011-07-17 DIAGNOSIS — Z471 Aftercare following joint replacement surgery: Secondary | ICD-10-CM | POA: Diagnosis not present

## 2011-07-17 DIAGNOSIS — I10 Essential (primary) hypertension: Secondary | ICD-10-CM | POA: Diagnosis not present

## 2011-07-17 DIAGNOSIS — IMO0001 Reserved for inherently not codable concepts without codable children: Secondary | ICD-10-CM | POA: Diagnosis not present

## 2011-07-17 DIAGNOSIS — E119 Type 2 diabetes mellitus without complications: Secondary | ICD-10-CM | POA: Diagnosis not present

## 2011-07-17 DIAGNOSIS — Z96649 Presence of unspecified artificial hip joint: Secondary | ICD-10-CM | POA: Diagnosis not present

## 2011-07-19 DIAGNOSIS — Z96649 Presence of unspecified artificial hip joint: Secondary | ICD-10-CM | POA: Diagnosis not present

## 2011-07-19 DIAGNOSIS — E119 Type 2 diabetes mellitus without complications: Secondary | ICD-10-CM | POA: Diagnosis not present

## 2011-07-19 DIAGNOSIS — I2510 Atherosclerotic heart disease of native coronary artery without angina pectoris: Secondary | ICD-10-CM | POA: Diagnosis not present

## 2011-07-19 DIAGNOSIS — IMO0001 Reserved for inherently not codable concepts without codable children: Secondary | ICD-10-CM | POA: Diagnosis not present

## 2011-07-19 DIAGNOSIS — I10 Essential (primary) hypertension: Secondary | ICD-10-CM | POA: Diagnosis not present

## 2011-07-19 DIAGNOSIS — M1610 Unilateral primary osteoarthritis, unspecified hip: Secondary | ICD-10-CM | POA: Diagnosis not present

## 2011-07-19 DIAGNOSIS — Z471 Aftercare following joint replacement surgery: Secondary | ICD-10-CM | POA: Diagnosis not present

## 2011-07-21 DIAGNOSIS — I2510 Atherosclerotic heart disease of native coronary artery without angina pectoris: Secondary | ICD-10-CM | POA: Diagnosis not present

## 2011-07-21 DIAGNOSIS — Z96649 Presence of unspecified artificial hip joint: Secondary | ICD-10-CM | POA: Diagnosis not present

## 2011-07-21 DIAGNOSIS — Z471 Aftercare following joint replacement surgery: Secondary | ICD-10-CM | POA: Diagnosis not present

## 2011-07-21 DIAGNOSIS — E119 Type 2 diabetes mellitus without complications: Secondary | ICD-10-CM | POA: Diagnosis not present

## 2011-07-21 DIAGNOSIS — I10 Essential (primary) hypertension: Secondary | ICD-10-CM | POA: Diagnosis not present

## 2011-07-21 DIAGNOSIS — IMO0001 Reserved for inherently not codable concepts without codable children: Secondary | ICD-10-CM | POA: Diagnosis not present

## 2011-09-08 NOTE — Telephone Encounter (Signed)
Nothing to document

## 2011-10-27 DIAGNOSIS — Z85828 Personal history of other malignant neoplasm of skin: Secondary | ICD-10-CM | POA: Diagnosis not present

## 2011-10-27 DIAGNOSIS — L570 Actinic keratosis: Secondary | ICD-10-CM | POA: Diagnosis not present

## 2011-11-21 DIAGNOSIS — E119 Type 2 diabetes mellitus without complications: Secondary | ICD-10-CM | POA: Diagnosis not present

## 2011-11-21 DIAGNOSIS — I259 Chronic ischemic heart disease, unspecified: Secondary | ICD-10-CM | POA: Diagnosis not present

## 2011-11-21 DIAGNOSIS — R5381 Other malaise: Secondary | ICD-10-CM | POA: Diagnosis not present

## 2011-11-21 DIAGNOSIS — R0602 Shortness of breath: Secondary | ICD-10-CM | POA: Diagnosis not present

## 2011-11-21 DIAGNOSIS — I10 Essential (primary) hypertension: Secondary | ICD-10-CM | POA: Diagnosis not present

## 2011-11-21 DIAGNOSIS — D510 Vitamin B12 deficiency anemia due to intrinsic factor deficiency: Secondary | ICD-10-CM | POA: Diagnosis not present

## 2011-11-21 DIAGNOSIS — E785 Hyperlipidemia, unspecified: Secondary | ICD-10-CM | POA: Diagnosis not present

## 2011-12-19 DIAGNOSIS — I10 Essential (primary) hypertension: Secondary | ICD-10-CM | POA: Diagnosis not present

## 2011-12-19 DIAGNOSIS — E119 Type 2 diabetes mellitus without complications: Secondary | ICD-10-CM | POA: Diagnosis not present

## 2012-02-22 DIAGNOSIS — E119 Type 2 diabetes mellitus without complications: Secondary | ICD-10-CM | POA: Diagnosis not present

## 2012-03-14 DIAGNOSIS — E119 Type 2 diabetes mellitus without complications: Secondary | ICD-10-CM | POA: Diagnosis not present

## 2012-04-18 DIAGNOSIS — E119 Type 2 diabetes mellitus without complications: Secondary | ICD-10-CM | POA: Diagnosis not present

## 2012-04-29 DIAGNOSIS — L570 Actinic keratosis: Secondary | ICD-10-CM | POA: Diagnosis not present

## 2012-04-29 DIAGNOSIS — Z85828 Personal history of other malignant neoplasm of skin: Secondary | ICD-10-CM | POA: Diagnosis not present

## 2012-10-17 DIAGNOSIS — K219 Gastro-esophageal reflux disease without esophagitis: Secondary | ICD-10-CM | POA: Diagnosis not present

## 2012-10-17 DIAGNOSIS — E119 Type 2 diabetes mellitus without complications: Secondary | ICD-10-CM | POA: Diagnosis not present

## 2012-10-17 DIAGNOSIS — M549 Dorsalgia, unspecified: Secondary | ICD-10-CM | POA: Diagnosis not present

## 2012-10-17 DIAGNOSIS — I2510 Atherosclerotic heart disease of native coronary artery without angina pectoris: Secondary | ICD-10-CM | POA: Diagnosis not present

## 2012-10-28 DIAGNOSIS — L570 Actinic keratosis: Secondary | ICD-10-CM | POA: Diagnosis not present

## 2012-10-28 DIAGNOSIS — D485 Neoplasm of uncertain behavior of skin: Secondary | ICD-10-CM | POA: Diagnosis not present

## 2012-10-28 DIAGNOSIS — D235 Other benign neoplasm of skin of trunk: Secondary | ICD-10-CM | POA: Diagnosis not present

## 2012-10-28 DIAGNOSIS — D044 Carcinoma in situ of skin of scalp and neck: Secondary | ICD-10-CM | POA: Diagnosis not present

## 2012-10-28 DIAGNOSIS — Z85828 Personal history of other malignant neoplasm of skin: Secondary | ICD-10-CM | POA: Diagnosis not present

## 2012-11-07 DIAGNOSIS — C4442 Squamous cell carcinoma of skin of scalp and neck: Secondary | ICD-10-CM | POA: Diagnosis not present

## 2012-12-27 DIAGNOSIS — R05 Cough: Secondary | ICD-10-CM | POA: Diagnosis not present

## 2012-12-27 DIAGNOSIS — I509 Heart failure, unspecified: Secondary | ICD-10-CM | POA: Diagnosis not present

## 2013-02-21 IMAGING — CR DG CHEST 2V
2 series · 2 of 2 positions shown · non-contrast
Comparison: 10/19/2007

CLINICAL DATA: Preop left hip arthroplasty

CHEST - 2 VIEW

[view not recorded (1 of 2)]
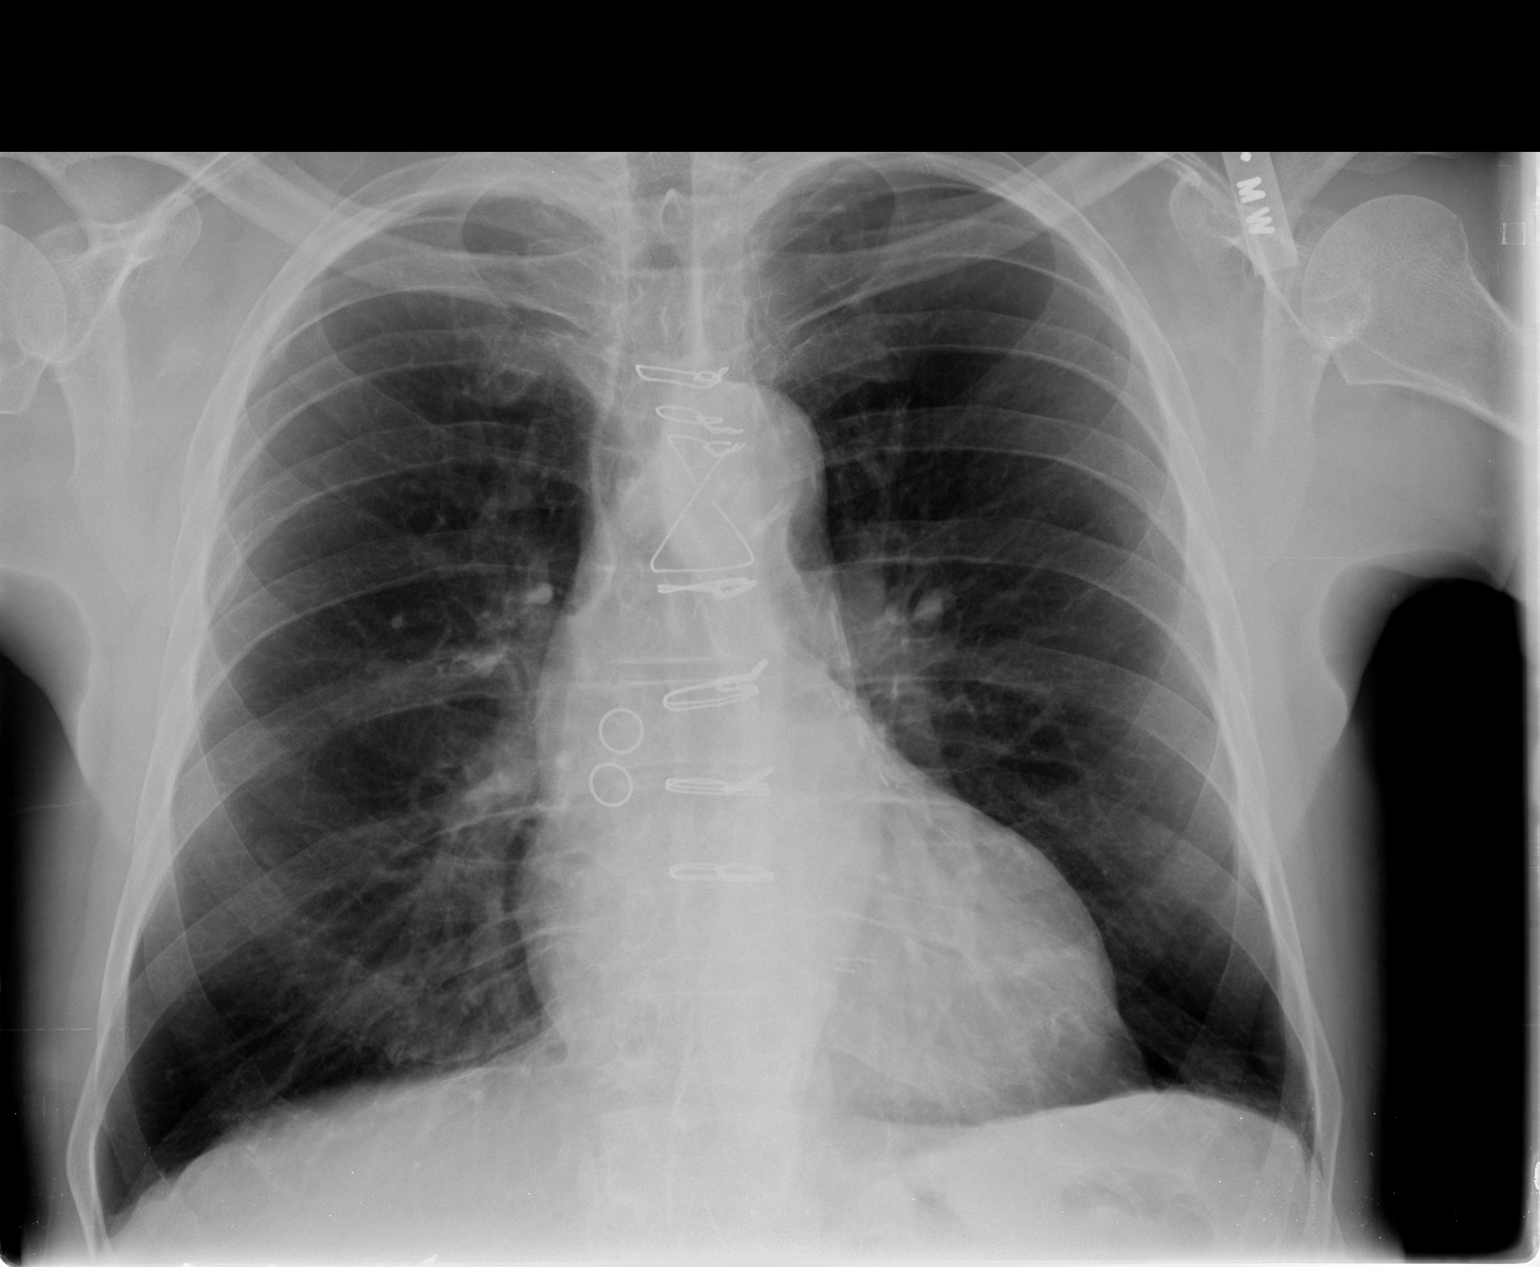

[view not recorded (2 of 2)]
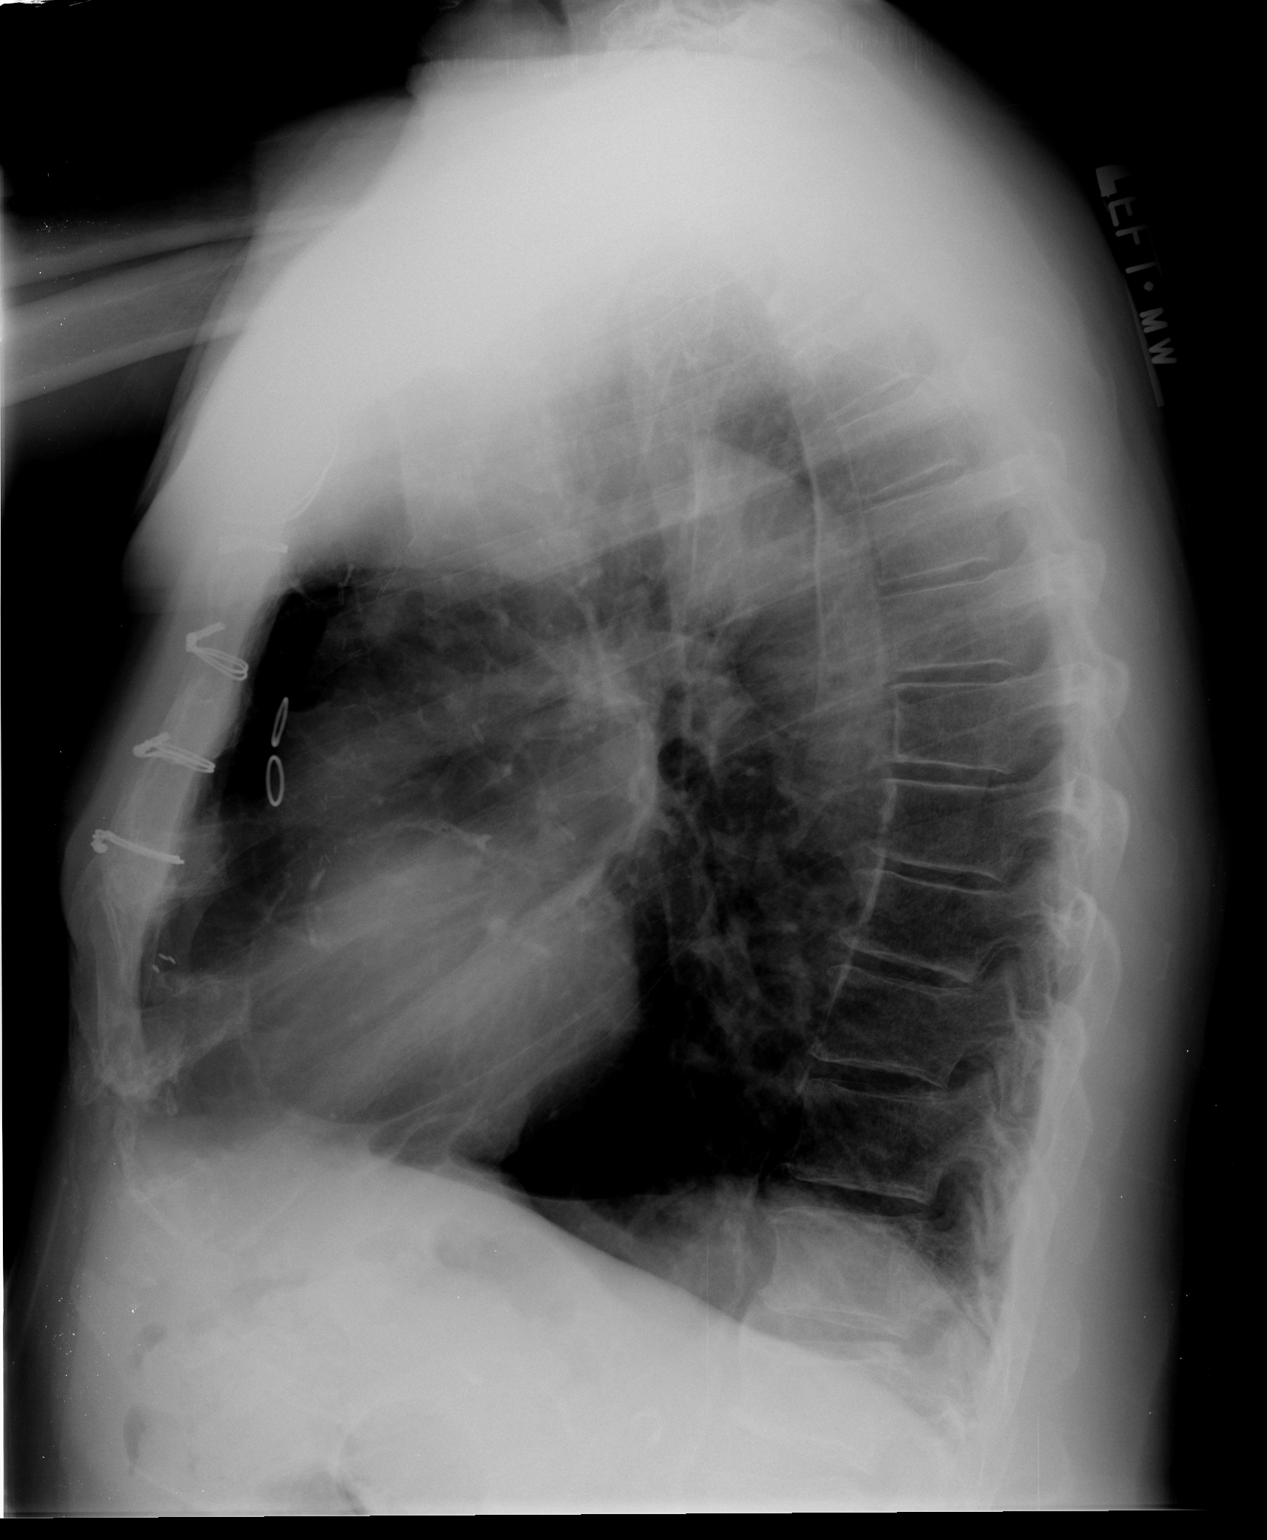

[2 of 2 positions shown; findings below may reference images not displayed]

FINDINGS: Prior CABG.  Heart size is upper normal.  Negative for
heart failure.  Negative for mass or infiltrate.  Underlying COPD
is present.
IMPRESSION: COPD.  No acute cardiopulmonary disease.

## 2013-02-28 IMAGING — CR DG HIP 1V PORT*L*
1 series · 1 of 1 positions shown · non-contrast
Comparison: Pelvis radiograph 06/13/2011

CLINICAL DATA: Postop left hip

PORTABLE LEFT HIP - 1 VIEW

[cross table lat hip]
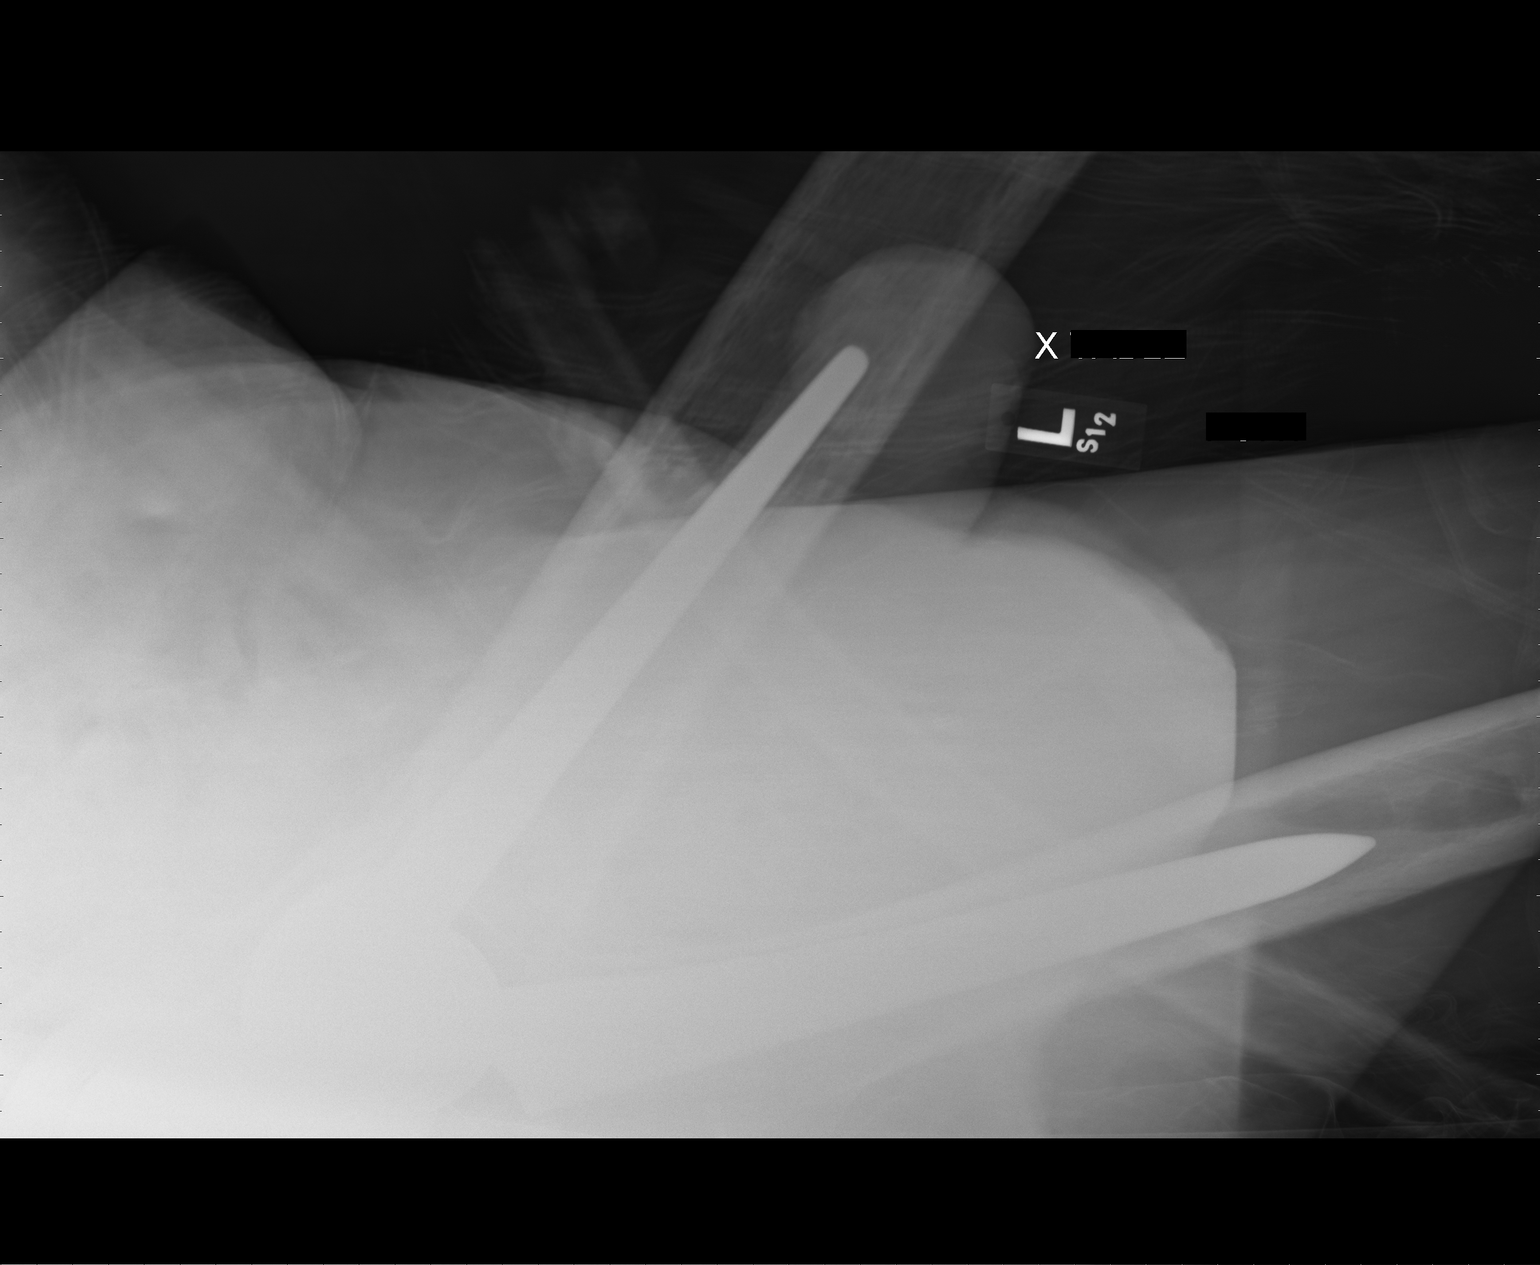

[1 of 1 positions shown; findings below may reference images not displayed]

FINDINGS: Cross-table lateral view of the left hip is performed.
The left hip arthroplasty is partially obscured by the patient's
right hip arthroplasty.  The femoral head component appears located
within the acetabular component.  No periprosthetic fracture is
identified.
IMPRESSION: Left hip arthroplasty appears located.  No complicating feature
identified.

## 2013-05-05 DIAGNOSIS — Z85828 Personal history of other malignant neoplasm of skin: Secondary | ICD-10-CM | POA: Diagnosis not present

## 2013-05-05 DIAGNOSIS — L570 Actinic keratosis: Secondary | ICD-10-CM | POA: Diagnosis not present

## 2013-05-19 ENCOUNTER — Encounter (HOSPITAL_COMMUNITY): Payer: Self-pay | Admitting: Emergency Medicine

## 2013-05-19 ENCOUNTER — Emergency Department (HOSPITAL_COMMUNITY): Payer: Medicare Other

## 2013-05-19 ENCOUNTER — Emergency Department (HOSPITAL_COMMUNITY)
Admission: EM | Admit: 2013-05-19 | Discharge: 2013-05-19 | Disposition: A | Discharge status: Home / Self Care | Payer: Medicare Other | Attending: Emergency Medicine | Admitting: Emergency Medicine

## 2013-05-19 DIAGNOSIS — Z860100 Personal history of colon polyps, unspecified: Secondary | ICD-10-CM | POA: Insufficient documentation

## 2013-05-19 DIAGNOSIS — Z9089 Acquired absence of other organs: Secondary | ICD-10-CM | POA: Diagnosis not present

## 2013-05-19 DIAGNOSIS — S20219A Contusion of unspecified front wall of thorax, initial encounter: Secondary | ICD-10-CM | POA: Diagnosis not present

## 2013-05-19 DIAGNOSIS — E119 Type 2 diabetes mellitus without complications: Secondary | ICD-10-CM | POA: Insufficient documentation

## 2013-05-19 DIAGNOSIS — Y9301 Activity, walking, marching and hiking: Secondary | ICD-10-CM | POA: Insufficient documentation

## 2013-05-19 DIAGNOSIS — I10 Essential (primary) hypertension: Secondary | ICD-10-CM | POA: Diagnosis not present

## 2013-05-19 DIAGNOSIS — Z8739 Personal history of other diseases of the musculoskeletal system and connective tissue: Secondary | ICD-10-CM | POA: Diagnosis not present

## 2013-05-19 DIAGNOSIS — E785 Hyperlipidemia, unspecified: Secondary | ICD-10-CM | POA: Diagnosis not present

## 2013-05-19 DIAGNOSIS — Z95818 Presence of other cardiac implants and grafts: Secondary | ICD-10-CM | POA: Insufficient documentation

## 2013-05-19 DIAGNOSIS — Z79899 Other long term (current) drug therapy: Secondary | ICD-10-CM | POA: Insufficient documentation

## 2013-05-19 DIAGNOSIS — Z87891 Personal history of nicotine dependence: Secondary | ICD-10-CM | POA: Diagnosis not present

## 2013-05-19 DIAGNOSIS — K219 Gastro-esophageal reflux disease without esophagitis: Secondary | ICD-10-CM | POA: Diagnosis not present

## 2013-05-19 DIAGNOSIS — I2510 Atherosclerotic heart disease of native coronary artery without angina pectoris: Secondary | ICD-10-CM | POA: Diagnosis not present

## 2013-05-19 DIAGNOSIS — W010XXA Fall on same level from slipping, tripping and stumbling without subsequent striking against object, initial encounter: Secondary | ICD-10-CM | POA: Insufficient documentation

## 2013-05-19 DIAGNOSIS — R0789 Other chest pain: Secondary | ICD-10-CM | POA: Diagnosis not present

## 2013-05-19 DIAGNOSIS — Z951 Presence of aortocoronary bypass graft: Secondary | ICD-10-CM | POA: Diagnosis not present

## 2013-05-19 DIAGNOSIS — S20211A Contusion of right front wall of thorax, initial encounter: Secondary | ICD-10-CM

## 2013-05-19 DIAGNOSIS — Y929 Unspecified place or not applicable: Secondary | ICD-10-CM | POA: Insufficient documentation

## 2013-05-19 DIAGNOSIS — S298XXA Other specified injuries of thorax, initial encounter: Secondary | ICD-10-CM | POA: Diagnosis not present
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
## 2013-05-19 NOTE — ED Notes (Signed)
Patient with no complaints at this time. Respirations even and unlabored. Skin warm/dry. Discharge instructions reviewed with patient at this time. Patient given opportunity to voice concerns/ask questions. Patient discharged at this time and left Emergency Department with steady gait.   

## 2013-05-19 NOTE — ED Notes (Signed)
Pt reports tripped and fell Saturday.  C/O pain to R chest.

## 2013-07-21 DIAGNOSIS — K921 Melena: Secondary | ICD-10-CM | POA: Diagnosis not present

## 2013-07-21 DIAGNOSIS — R197 Diarrhea, unspecified: Secondary | ICD-10-CM | POA: Diagnosis not present

## 2013-07-21 DIAGNOSIS — K922 Gastrointestinal hemorrhage, unspecified: Secondary | ICD-10-CM | POA: Diagnosis not present

## 2013-11-03 DIAGNOSIS — L570 Actinic keratosis: Secondary | ICD-10-CM | POA: Diagnosis not present

## 2013-11-03 DIAGNOSIS — Z85828 Personal history of other malignant neoplasm of skin: Secondary | ICD-10-CM | POA: Diagnosis not present

## 2014-02-09 DIAGNOSIS — I10 Essential (primary) hypertension: Secondary | ICD-10-CM | POA: Diagnosis not present

## 2014-02-27 DIAGNOSIS — J312 Chronic pharyngitis: Secondary | ICD-10-CM | POA: Diagnosis not present

## 2014-02-27 DIAGNOSIS — R05 Cough: Secondary | ICD-10-CM | POA: Diagnosis not present

## 2014-03-13 ENCOUNTER — Other Ambulatory Visit (HOSPITAL_COMMUNITY): Payer: Self-pay | Admitting: Emergency Medicine

## 2014-03-13 ENCOUNTER — Ambulatory Visit (HOSPITAL_COMMUNITY)
Admission: RE | Admit: 2014-03-13 | Discharge: 2014-03-13 | Disposition: A | Discharge status: Home / Self Care | Payer: Medicare Other | Source: Ambulatory Visit | Attending: Internal Medicine | Admitting: Internal Medicine

## 2014-03-13 DIAGNOSIS — R490 Dysphonia: Secondary | ICD-10-CM | POA: Diagnosis not present

## 2014-03-13 DIAGNOSIS — Z720 Tobacco use: Secondary | ICD-10-CM | POA: Diagnosis not present

## 2014-03-13 DIAGNOSIS — R05 Cough: Secondary | ICD-10-CM | POA: Diagnosis present

## 2014-03-13 DIAGNOSIS — J984 Other disorders of lung: Secondary | ICD-10-CM | POA: Diagnosis not present

## 2014-03-13 DIAGNOSIS — Z716 Tobacco abuse counseling: Secondary | ICD-10-CM | POA: Diagnosis not present

## 2014-03-13 DIAGNOSIS — J449 Chronic obstructive pulmonary disease, unspecified: Secondary | ICD-10-CM | POA: Insufficient documentation

## 2014-03-13 DIAGNOSIS — R059 Cough, unspecified: Secondary | ICD-10-CM

## 2014-03-13 DIAGNOSIS — M47812 Spondylosis without myelopathy or radiculopathy, cervical region: Secondary | ICD-10-CM | POA: Diagnosis not present

## 2014-03-13 DIAGNOSIS — M5030 Other cervical disc degeneration, unspecified cervical region: Secondary | ICD-10-CM | POA: Diagnosis not present

## 2014-03-13 DIAGNOSIS — R0602 Shortness of breath: Secondary | ICD-10-CM | POA: Diagnosis not present

## 2014-03-16 ENCOUNTER — Other Ambulatory Visit (HOSPITAL_COMMUNITY): Payer: Self-pay | Admitting: Internal Medicine

## 2014-03-16 ENCOUNTER — Other Ambulatory Visit (HOSPITAL_COMMUNITY): Payer: Self-pay | Admitting: Respiratory Therapy

## 2014-03-16 DIAGNOSIS — J441 Chronic obstructive pulmonary disease with (acute) exacerbation: Secondary | ICD-10-CM

## 2014-03-16 DIAGNOSIS — R062 Wheezing: Secondary | ICD-10-CM

## 2014-03-18 ENCOUNTER — Ambulatory Visit (HOSPITAL_COMMUNITY): Payer: Medicare Other

## 2014-03-25 ENCOUNTER — Ambulatory Visit (HOSPITAL_COMMUNITY)
Admission: RE | Admit: 2014-03-25 | Discharge: 2014-03-25 | Disposition: A | Discharge status: Home / Self Care | Payer: Medicare Other | Source: Ambulatory Visit | Attending: Internal Medicine | Admitting: Internal Medicine

## 2014-03-25 DIAGNOSIS — I712 Thoracic aortic aneurysm, without rupture: Secondary | ICD-10-CM | POA: Diagnosis not present

## 2014-03-25 DIAGNOSIS — Z87891 Personal history of nicotine dependence: Secondary | ICD-10-CM | POA: Diagnosis not present

## 2014-03-25 DIAGNOSIS — I719 Aortic aneurysm of unspecified site, without rupture: Secondary | ICD-10-CM | POA: Diagnosis not present

## 2014-03-25 DIAGNOSIS — J984 Other disorders of lung: Secondary | ICD-10-CM | POA: Diagnosis not present

## 2014-03-25 DIAGNOSIS — R062 Wheezing: Secondary | ICD-10-CM | POA: Diagnosis not present

## 2014-04-01 ENCOUNTER — Ambulatory Visit (HOSPITAL_COMMUNITY)
Admission: RE | Admit: 2014-04-01 | Discharge: 2014-04-01 | Disposition: A | Discharge status: Home / Self Care | Payer: Medicare Other | Source: Ambulatory Visit | Attending: Internal Medicine | Admitting: Internal Medicine

## 2014-04-01 DIAGNOSIS — J441 Chronic obstructive pulmonary disease with (acute) exacerbation: Secondary | ICD-10-CM | POA: Insufficient documentation

## 2014-04-01 LAB — PULMONARY FUNCTION TEST
DL/VA % PRED: 68 %
DL/VA: 3.19 ml/min/mmHg/L
DLCO COR: 17.53 ml/min/mmHg
DLCO UNC % PRED: 52 %
DLCO UNC: 17.53 ml/min/mmHg
DLCO cor % pred: 52 %
FEF 25-75 POST: 1.94 L/s
FEF 25-75 PRE: 1.49 L/s
FEF2575-%Change-Post: 30 %
FEF2575-%PRED-POST: 93 %
FEF2575-%PRED-PRE: 71 %
FEV1-%Change-Post: 9 %
FEV1-%PRED-POST: 86 %
FEV1-%Pred-Pre: 79 %
FEV1-PRE: 2.39 L
FEV1-Post: 2.61 L
FEV1FVC-%Change-Post: 3 %
FEV1FVC-%PRED-PRE: 93 %
FEV6-%CHANGE-POST: 3 %
FEV6-%PRED-POST: 92 %
FEV6-%PRED-PRE: 89 %
FEV6-POST: 3.65 L
FEV6-Pre: 3.51 L
FEV6FVC-%CHANGE-POST: -1 %
FEV6FVC-%PRED-POST: 103 %
FEV6FVC-%Pred-Pre: 104 %
FVC-%CHANGE-POST: 5 %
FVC-%PRED-POST: 89 %
FVC-%PRED-PRE: 84 %
FVC-PRE: 3.56 L
FVC-Post: 3.75 L
PRE FEV1/FVC RATIO: 67 %
Post FEV1/FVC ratio: 70 %
Post FEV6/FVC ratio: 97 %
Pre FEV6/FVC Ratio: 99 %
RV % PRED: 141 %
RV: 3.83 L
TLC % pred: 98 %
TLC: 7.18 L

## 2014-04-01 MED ORDER — ALBUTEROL SULFATE (2.5 MG/3ML) 0.083% IN NEBU
2.5000 mg | INHALATION_SOLUTION | Freq: Once | RESPIRATORY_TRACT | Status: AC
  Administered 2014-04-01: 2.5 mg via RESPIRATORY_TRACT

## 2014-05-27 DIAGNOSIS — L570 Actinic keratosis: Secondary | ICD-10-CM | POA: Diagnosis not present

## 2014-05-27 DIAGNOSIS — Z85828 Personal history of other malignant neoplasm of skin: Secondary | ICD-10-CM | POA: Diagnosis not present

## 2014-06-12 DIAGNOSIS — R05 Cough: Secondary | ICD-10-CM | POA: Diagnosis not present

## 2014-11-25 DIAGNOSIS — Z85828 Personal history of other malignant neoplasm of skin: Secondary | ICD-10-CM | POA: Diagnosis not present

## 2014-11-25 DIAGNOSIS — L570 Actinic keratosis: Secondary | ICD-10-CM | POA: Diagnosis not present

## 2015-05-10 DIAGNOSIS — Z85828 Personal history of other malignant neoplasm of skin: Secondary | ICD-10-CM | POA: Diagnosis not present

## 2015-05-10 DIAGNOSIS — L570 Actinic keratosis: Secondary | ICD-10-CM | POA: Diagnosis not present

## 2015-07-11 ENCOUNTER — Encounter (HOSPITAL_COMMUNITY): Payer: Self-pay

## 2015-07-11 ENCOUNTER — Inpatient Hospital Stay (HOSPITAL_COMMUNITY): Payer: Medicare Other

## 2015-07-11 ENCOUNTER — Inpatient Hospital Stay (HOSPITAL_COMMUNITY)
Admission: EM | Admit: 2015-07-11 | Discharge: 2015-07-13 | DRG: 871 | Disposition: A | Discharge status: Home / Self Care | Payer: Medicare Other | Attending: Internal Medicine | Admitting: Internal Medicine

## 2015-07-11 ENCOUNTER — Emergency Department (HOSPITAL_COMMUNITY): Payer: Medicare Other

## 2015-07-11 DIAGNOSIS — Z96641 Presence of right artificial hip joint: Secondary | ICD-10-CM | POA: Diagnosis present

## 2015-07-11 DIAGNOSIS — E118 Type 2 diabetes mellitus with unspecified complications: Secondary | ICD-10-CM | POA: Diagnosis not present

## 2015-07-11 DIAGNOSIS — E119 Type 2 diabetes mellitus without complications: Secondary | ICD-10-CM

## 2015-07-11 DIAGNOSIS — Z7982 Long term (current) use of aspirin: Secondary | ICD-10-CM | POA: Diagnosis not present

## 2015-07-11 DIAGNOSIS — A419 Sepsis, unspecified organism: Principal | ICD-10-CM | POA: Diagnosis present

## 2015-07-11 DIAGNOSIS — R0602 Shortness of breath: Secondary | ICD-10-CM | POA: Diagnosis not present

## 2015-07-11 DIAGNOSIS — Z95810 Presence of automatic (implantable) cardiac defibrillator: Secondary | ICD-10-CM | POA: Diagnosis not present

## 2015-07-11 DIAGNOSIS — R509 Fever, unspecified: Secondary | ICD-10-CM | POA: Diagnosis not present

## 2015-07-11 DIAGNOSIS — J189 Pneumonia, unspecified organism: Secondary | ICD-10-CM | POA: Diagnosis present

## 2015-07-11 DIAGNOSIS — Z87891 Personal history of nicotine dependence: Secondary | ICD-10-CM

## 2015-07-11 DIAGNOSIS — D696 Thrombocytopenia, unspecified: Secondary | ICD-10-CM | POA: Diagnosis present

## 2015-07-11 DIAGNOSIS — M1612 Unilateral primary osteoarthritis, left hip: Secondary | ICD-10-CM | POA: Diagnosis present

## 2015-07-11 DIAGNOSIS — D6959 Other secondary thrombocytopenia: Secondary | ICD-10-CM | POA: Diagnosis present

## 2015-07-11 DIAGNOSIS — Z96651 Presence of right artificial knee joint: Secondary | ICD-10-CM | POA: Diagnosis present

## 2015-07-11 DIAGNOSIS — K219 Gastro-esophageal reflux disease without esophagitis: Secondary | ICD-10-CM | POA: Diagnosis present

## 2015-07-11 DIAGNOSIS — I10 Essential (primary) hypertension: Secondary | ICD-10-CM | POA: Diagnosis present

## 2015-07-11 DIAGNOSIS — Z7984 Long term (current) use of oral hypoglycemic drugs: Secondary | ICD-10-CM | POA: Diagnosis not present

## 2015-07-11 DIAGNOSIS — I959 Hypotension, unspecified: Secondary | ICD-10-CM | POA: Diagnosis present

## 2015-07-11 DIAGNOSIS — G9341 Metabolic encephalopathy: Secondary | ICD-10-CM

## 2015-07-11 DIAGNOSIS — G9340 Encephalopathy, unspecified: Secondary | ICD-10-CM | POA: Diagnosis present

## 2015-07-11 DIAGNOSIS — N179 Acute kidney failure, unspecified: Secondary | ICD-10-CM | POA: Diagnosis present

## 2015-07-11 DIAGNOSIS — E785 Hyperlipidemia, unspecified: Secondary | ICD-10-CM | POA: Diagnosis present

## 2015-07-11 DIAGNOSIS — R05 Cough: Secondary | ICD-10-CM | POA: Diagnosis not present

## 2015-07-11 DIAGNOSIS — E1165 Type 2 diabetes mellitus with hyperglycemia: Secondary | ICD-10-CM | POA: Diagnosis not present

## 2015-07-11 DIAGNOSIS — Z951 Presence of aortocoronary bypass graft: Secondary | ICD-10-CM | POA: Diagnosis not present

## 2015-07-11 DIAGNOSIS — R0682 Tachypnea, not elsewhere classified: Secondary | ICD-10-CM | POA: Diagnosis not present

## 2015-07-11 DIAGNOSIS — Z96642 Presence of left artificial hip joint: Secondary | ICD-10-CM | POA: Diagnosis present

## 2015-07-11 DIAGNOSIS — I2510 Atherosclerotic heart disease of native coronary artery without angina pectoris: Secondary | ICD-10-CM | POA: Diagnosis present

## 2015-07-11 LAB — COMPREHENSIVE METABOLIC PANEL
ALBUMIN: 3.2 g/dL — AB (ref 3.5–5.0)
ALK PHOS: 43 U/L (ref 38–126)
ALT: 26 U/L (ref 17–63)
ALT: 23 U/L (ref 17–63)
ANION GAP: 9 (ref 5–15)
AST: 20 U/L (ref 15–41)
AST: 20 U/L (ref 15–41)
Albumin: 3.6 g/dL (ref 3.5–5.0)
Alkaline Phosphatase: 48 U/L (ref 38–126)
Anion gap: 10 (ref 5–15)
BILIRUBIN TOTAL: 1.4 mg/dL — AB (ref 0.3–1.2)
BILIRUBIN TOTAL: 2.1 mg/dL — AB (ref 0.3–1.2)
BUN: 18 mg/dL (ref 6–20)
BUN: 16 mg/dL (ref 6–20)
CALCIUM: 8 mg/dL — AB (ref 8.9–10.3)
CHLORIDE: 97 mmol/L — AB (ref 101–111)
CO2: 26 mmol/L (ref 22–32)
CO2: 26 mmol/L (ref 22–32)
Calcium: 8.5 mg/dL — ABNORMAL LOW (ref 8.9–10.3)
Chloride: 98 mmol/L — ABNORMAL LOW (ref 101–111)
Creatinine, Ser: 1.25 mg/dL — ABNORMAL HIGH (ref 0.61–1.24)
Creatinine, Ser: 1.23 mg/dL (ref 0.61–1.24)
GFR calc Af Amer: >60 mL/min (ref >60)
GFR, EST NON AFRICAN AMERICAN: 52 mL/min — AB (ref >60)
GFR, EST NON AFRICAN AMERICAN: 53 mL/min — AB (ref >60)
GLUCOSE: 202 mg/dL — AB (ref 65–99)
Glucose, Bld: 193 mg/dL — ABNORMAL HIGH (ref 65–99)
POTASSIUM: 3.5 mmol/L (ref 3.5–5.1)
POTASSIUM: 3.5 mmol/L (ref 3.5–5.1)
Sodium: 133 mmol/L — ABNORMAL LOW (ref 135–145)
Sodium: 133 mmol/L — ABNORMAL LOW (ref 135–145)
TOTAL PROTEIN: 6.2 g/dL — AB (ref 6.5–8.1)
TOTAL PROTEIN: 5.6 g/dL — AB (ref 6.5–8.1)

## 2015-07-11 LAB — APTT: aPTT: 33 seconds (ref 24–37)

## 2015-07-11 LAB — CBC WITH DIFFERENTIAL/PLATELET
BASOS ABS: 0 10*3/uL (ref 0.0–0.1)
BASOS PCT: 0 %
Basophils Absolute: 0 10*3/uL (ref 0.0–0.1)
Basophils Relative: 0 %
EOS ABS: 0 10*3/uL (ref 0.0–0.7)
EOS PCT: 0 %
Eosinophils Absolute: 0 10*3/uL (ref 0.0–0.7)
Eosinophils Relative: 0 %
HCT: 31.8 % — ABNORMAL LOW (ref 39.0–52.0)
HEMATOCRIT: 30.1 % — AB (ref 39.0–52.0)
HEMOGLOBIN: 11.4 g/dL — AB (ref 13.0–17.0)
HEMOGLOBIN: 10.8 g/dL — AB (ref 13.0–17.0)
LYMPHS ABS: 0.5 10*3/uL — AB (ref 0.7–4.0)
LYMPHS ABS: 1 10*3/uL (ref 0.7–4.0)
LYMPHS PCT: 5 %
LYMPHS PCT: 9 %
MCH: 32.9 pg (ref 26.0–34.0)
MCH: 33.2 pg (ref 26.0–34.0)
MCHC: 35.8 g/dL (ref 30.0–36.0)
MCHC: 35.9 g/dL (ref 30.0–36.0)
MCV: 91.6 fL (ref 78.0–100.0)
MCV: 92.6 fL (ref 78.0–100.0)
MONO ABS: 0.9 10*3/uL (ref 0.1–1.0)
MONOS PCT: 9 %
Monocytes Absolute: 0.7 10*3/uL (ref 0.1–1.0)
Monocytes Relative: 7 %
NEUTROS ABS: 8.9 10*3/uL — AB (ref 1.7–7.7)
NEUTROS PCT: 89 %
NEUTROS PCT: 82 %
Neutro Abs: 9.2 10*3/uL — ABNORMAL HIGH (ref 1.7–7.7)
PLATELETS: 119 10*3/uL — AB (ref 150–400)
Platelets: 111 10*3/uL — ABNORMAL LOW (ref 150–400)
RBC: 3.47 MIL/uL — AB (ref 4.22–5.81)
RBC: 3.25 MIL/uL — ABNORMAL LOW (ref 4.22–5.81)
RDW: 13.5 % (ref 11.5–15.5)
RDW: 13.6 % (ref 11.5–15.5)
WBC: 10.4 10*3/uL (ref 4.0–10.5)
WBC: 10.8 10*3/uL — ABNORMAL HIGH (ref 4.0–10.5)

## 2015-07-11 LAB — URINE MICROSCOPIC-ADD ON: SQUAMOUS EPITHELIAL / LPF: NONE SEEN

## 2015-07-11 LAB — URINALYSIS, ROUTINE W REFLEX MICROSCOPIC
Bilirubin Urine: NEGATIVE
Glucose, UA: NEGATIVE mg/dL
Ketones, ur: NEGATIVE mg/dL
LEUKOCYTES UA: NEGATIVE
NITRITE: NEGATIVE
Protein, ur: NEGATIVE mg/dL
SPECIFIC GRAVITY, URINE: 1.01 (ref 1.005–1.030)
pH: 6 (ref 5.0–8.0)

## 2015-07-11 LAB — PROTIME-INR
INR: 1.31 (ref 0.00–1.49)
PROTHROMBIN TIME: 16.4 s — AB (ref 11.6–15.2)

## 2015-07-11 LAB — MRSA PCR SCREENING: MRSA by PCR: NEGATIVE

## 2015-07-11 LAB — I-STAT CG4 LACTIC ACID, ED: LACTIC ACID, VENOUS: 1.02 mmol/L (ref 0.5–2.0)

## 2015-07-11 LAB — GLUCOSE, CAPILLARY: GLUCOSE-CAPILLARY: 159 mg/dL — AB (ref 65–99)

## 2015-07-11 LAB — PROCALCITONIN: Procalcitonin: 4.29 ng/mL

## 2015-07-11 LAB — LACTIC ACID, PLASMA: LACTIC ACID, VENOUS: 1.2 mmol/L (ref 0.5–2.0)

## 2015-07-11 MED ORDER — ACETAMINOPHEN 650 MG RE SUPP: 650.0000 mg | Freq: Four times a day (QID) | RECTAL | Status: DC | PRN

## 2015-07-11 MED ORDER — ONDANSETRON HCL 4 MG/2ML IJ SOLN: 4.0000 mg | Freq: Four times a day (QID) | INTRAMUSCULAR | Status: DC | PRN

## 2015-07-11 MED ORDER — SODIUM CHLORIDE 0.9 % IV BOLUS (SEPSIS)
1000.0000 mL | INTRAVENOUS | Status: DC
  Administered 2015-07-11: 1000 mL via INTRAVENOUS

## 2015-07-11 MED ORDER — VANCOMYCIN HCL IN DEXTROSE 1-5 GM/200ML-% IV SOLN: 1000.0000 mg | Freq: Once | INTRAVENOUS | Status: DC

## 2015-07-11 MED ORDER — ENOXAPARIN SODIUM 40 MG/0.4ML ~~LOC~~ SOLN
40.0000 mg | SUBCUTANEOUS | Status: DC
  Administered 2015-07-11 – 2015-07-12 (×2): 40 mg via SUBCUTANEOUS
  Filled 2015-07-11 (×2): qty 0.4

## 2015-07-11 MED ORDER — VANCOMYCIN HCL IN DEXTROSE 750-5 MG/150ML-% IV SOLN
INTRAVENOUS | Status: AC
  Filled 2015-07-11: qty 150

## 2015-07-11 MED ORDER — SODIUM CHLORIDE 0.9 % IV SOLN
1500.0000 mg | Freq: Once | INTRAVENOUS | Status: AC
  Administered 2015-07-11: 1500 mg via INTRAVENOUS
  Filled 2015-07-11: qty 1500

## 2015-07-11 MED ORDER — ONDANSETRON HCL 4 MG PO TABS: 4.0000 mg | ORAL_TABLET | Freq: Four times a day (QID) | ORAL | Status: DC | PRN

## 2015-07-11 MED ORDER — PIPERACILLIN-TAZOBACTAM 3.375 G IVPB 30 MIN
3.3750 g | Freq: Once | INTRAVENOUS | Status: AC
  Administered 2015-07-11: 3.375 g via INTRAVENOUS
  Filled 2015-07-11: qty 50

## 2015-07-11 MED ORDER — SODIUM CHLORIDE 0.9 % IV BOLUS (SEPSIS)
500.0000 mL | INTRAVENOUS | Status: AC
  Administered 2015-07-11: 500 mL via INTRAVENOUS

## 2015-07-11 MED ORDER — ASPIRIN EC 81 MG PO TBEC
81.0000 mg | DELAYED_RELEASE_TABLET | Freq: Every day | ORAL | Status: DC
  Administered 2015-07-11 – 2015-07-13 (×3): 81 mg via ORAL
  Filled 2015-07-11 (×3): qty 1

## 2015-07-11 MED ORDER — SODIUM CHLORIDE 0.9% FLUSH
3.0000 mL | Freq: Two times a day (BID) | INTRAVENOUS | Status: DC
  Administered 2015-07-12: 3 mL via INTRAVENOUS

## 2015-07-11 MED ORDER — INSULIN ASPART 100 UNIT/ML ~~LOC~~ SOLN
0.0000 [IU] | SUBCUTANEOUS | Status: DC
  Administered 2015-07-11: 3 [IU] via SUBCUTANEOUS
  Administered 2015-07-12: 5 [IU] via SUBCUTANEOUS
  Administered 2015-07-12 (×3): 3 [IU] via SUBCUTANEOUS
  Administered 2015-07-12: 2 [IU] via SUBCUTANEOUS
  Administered 2015-07-13: 3 [IU] via SUBCUTANEOUS
  Administered 2015-07-13: 2 [IU] via SUBCUTANEOUS
  Administered 2015-07-13: 5 [IU] via SUBCUTANEOUS

## 2015-07-11 MED ORDER — ACETAMINOPHEN 325 MG PO TABS
650.0000 mg | ORAL_TABLET | Freq: Once | ORAL | Status: AC
  Administered 2015-07-11: 650 mg via ORAL
  Filled 2015-07-11: qty 2

## 2015-07-11 MED ORDER — OXYCODONE HCL 5 MG PO TABS
5.0000 mg | ORAL_TABLET | ORAL | Status: DC | PRN
  Filled 2015-07-11: qty 1

## 2015-07-11 MED ORDER — ACETAMINOPHEN 325 MG PO TABS: 650.0000 mg | ORAL_TABLET | Freq: Four times a day (QID) | ORAL | Status: DC | PRN

## 2015-07-11 MED ORDER — PIPERACILLIN-TAZOBACTAM 3.375 G IVPB
3.3750 g | Freq: Three times a day (TID) | INTRAVENOUS | Status: DC
  Administered 2015-07-11 – 2015-07-13 (×5): 3.375 g via INTRAVENOUS
  Filled 2015-07-11 (×5): qty 50

## 2015-07-11 MED ORDER — DEXTROSE 5 % IV SOLN
2.0000 g | Freq: Once | INTRAVENOUS | Status: DC
  Filled 2015-07-11: qty 2

## 2015-07-11 MED ORDER — VANCOMYCIN HCL IN DEXTROSE 750-5 MG/150ML-% IV SOLN
750.0000 mg | Freq: Two times a day (BID) | INTRAVENOUS | Status: DC
  Administered 2015-07-12 – 2015-07-13 (×3): 750 mg via INTRAVENOUS
  Filled 2015-07-11 (×5): qty 150

## 2015-07-11 MED ORDER — ATORVASTATIN CALCIUM 40 MG PO TABS
40.0000 mg | ORAL_TABLET | Freq: Every day | ORAL | Status: DC
  Administered 2015-07-11 – 2015-07-12 (×2): 40 mg via ORAL
  Filled 2015-07-11 (×2): qty 1

## 2015-07-11 MED ORDER — SODIUM CHLORIDE 0.9 % IV SOLN
INTRAVENOUS | Status: DC
  Administered 2015-07-11: 21:00:00 via INTRAVENOUS
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
## 2015-07-11 NOTE — ED Notes (Addendum)
Patient had 300ML infused of normal saline infused per EMS PTA.
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
## 2015-07-11 NOTE — Progress Notes (Signed)
Pharmacy Antibiotic Note  Dustin Vincent is a 82 y.o. male admitted on 07/11/2015 with sepsis.  Pharmacy has been consulted for Vancomycin and Zosyn dosing. 82 yo male presented to ED with confusion and febrile 103. Chest xray showed Right base consolidation.  consistent with PNA. Empiric tx with broad spectrum abx. Plan: Vancomycin 750mg   IV every 12 hours.  Goal trough 15-20 mcg/mL. Zosyn 3.375g IV q8h (4 hour infusion).  Height: 5\' 6"  (167.6 cm) Weight: 180 lb (81.647 kg) IBW/kg (Calculated) : 63.8  Temp (24hrs), Avg:101.9 F (38.8 C), Min:99.9 F (37.7 C), Max:103 F (39.4 C)   Recent Labs Lab 07/11/15 1456 07/11/15 1509  WBC 10.4  --   CREATININE 1.25*  --   LATICACIDVEN  --  1.02    Estimated Creatinine Clearance: 46.5 mL/min (by C-G formula based on Cr of 1.25).    Allergies  Allergen Reactions  . Sulfa Antibiotics Rash    Was a salve    Antimicrobials this admission: vancomycon 2/12 >>  zosyn 2/12>>   Dose adjustments this admission: none  Microbiology results: 2/12 BCx: pending 2/12 UCx: pending   Thank you for allowing pharmacy to be a part of this patient's care.  Isac Sarna, BS [place], [place] Clinical Pharmacist Pager [phone] 07/11/2015 6:22 PM

## 2015-07-11 NOTE — ED Notes (Signed)
No family here with pt to answer triage questions and pt is confused

## 2015-07-12 ENCOUNTER — Inpatient Hospital Stay (HOSPITAL_COMMUNITY): Payer: Medicare Other

## 2015-07-12 DIAGNOSIS — D696 Thrombocytopenia, unspecified: Secondary | ICD-10-CM

## 2015-07-12 DIAGNOSIS — A419 Sepsis, unspecified organism: Principal | ICD-10-CM

## 2015-07-12 DIAGNOSIS — J189 Pneumonia, unspecified organism: Secondary | ICD-10-CM

## 2015-07-12 DIAGNOSIS — E118 Type 2 diabetes mellitus with unspecified complications: Secondary | ICD-10-CM

## 2015-07-12 DIAGNOSIS — G9340 Encephalopathy, unspecified: Secondary | ICD-10-CM

## 2015-07-12 LAB — CBC
HCT: 29.5 % — ABNORMAL LOW (ref 39.0–52.0)
Hemoglobin: 10.4 g/dL — ABNORMAL LOW (ref 13.0–17.0)
MCH: 32.8 pg (ref 26.0–34.0)
MCHC: 35.3 g/dL (ref 30.0–36.0)
MCV: 93.1 fL (ref 78.0–100.0)
PLATELETS: 106 10*3/uL — AB (ref 150–400)
RBC: 3.17 MIL/uL — ABNORMAL LOW (ref 4.22–5.81)
RDW: 13.7 % (ref 11.5–15.5)
WBC: 8.5 10*3/uL (ref 4.0–10.5)

## 2015-07-12 LAB — GLUCOSE, CAPILLARY
GLUCOSE-CAPILLARY: 144 mg/dL — AB (ref 65–99)
GLUCOSE-CAPILLARY: 177 mg/dL — AB (ref 65–99)
GLUCOSE-CAPILLARY: 184 mg/dL — AB (ref 65–99)
GLUCOSE-CAPILLARY: 220 mg/dL — AB (ref 65–99)
Glucose-Capillary: 168 mg/dL — ABNORMAL HIGH (ref 65–99)
Glucose-Capillary: 115 mg/dL — ABNORMAL HIGH (ref 65–99)

## 2015-07-12 LAB — URINE CULTURE: Culture: 2000

## 2015-07-12 LAB — BASIC METABOLIC PANEL
Anion gap: 7 (ref 5–15)
BUN: 17 mg/dL (ref 6–20)
CALCIUM: 7.9 mg/dL — AB (ref 8.9–10.3)
CO2: 25 mmol/L (ref 22–32)
CREATININE: 1.14 mg/dL (ref 0.61–1.24)
Chloride: 100 mmol/L — ABNORMAL LOW (ref 101–111)
GFR calc Af Amer: >60 mL/min (ref >60)
GFR calc non Af Amer: 58 mL/min — ABNORMAL LOW (ref >60)
GLUCOSE: 166 mg/dL — AB (ref 65–99)
Potassium: 3.6 mmol/L (ref 3.5–5.1)
Sodium: 132 mmol/L — ABNORMAL LOW (ref 135–145)

## 2015-07-12 LAB — INFLUENZA PANEL BY PCR (TYPE A & B)
H1N1FLUPCR: NOT DETECTED
INFLBPCR: NEGATIVE
Influenza A By PCR: NEGATIVE

## 2015-07-12 LAB — LACTIC ACID, PLASMA: Lactic Acid, Venous: 0.9 mmol/L (ref 0.5–2.0)

## 2015-07-12 MED ORDER — METOPROLOL SUCCINATE ER 50 MG PO TB24
50.0000 mg | ORAL_TABLET | Freq: Every day | ORAL | Status: DC
  Administered 2015-07-12 – 2015-07-13 (×2): 50 mg via ORAL
  Filled 2015-07-12 (×2): qty 1

## 2015-07-12 MED ORDER — PSYLLIUM 95 % PO PACK
1.0000 | PACK | Freq: Every day | ORAL | Status: DC
  Administered 2015-07-12: 1 via ORAL
  Filled 2015-07-12 (×3): qty 1

## 2015-07-12 MED ORDER — BISACODYL 10 MG RE SUPP
10.0000 mg | Freq: Every day | RECTAL | Status: DC | PRN
  Administered 2015-07-12: 10 mg via RECTAL
  Filled 2015-07-12: qty 1

## 2015-07-12 NOTE — Progress Notes (Signed)
Called report to [place], Therapist, sports on dept 300. Verbalized understanding. Pt transferred to room 309 in safe and stable condition.

## 2015-07-12 NOTE — Progress Notes (Signed)
[facility] PROGRESS NOTE  KOLBEN DEFREESE [id number] DOB: 1931/10/01 DOA: 07/11/2015 PCP: Wende Neighbors, MD  Assessment/Plan: 1. Sepsis, present on admission evidence by a temperature of [id number], metabolic encephalopathy, respiratory rate of 27, with source of infection likely to be pneumonia. He was started sepsis protocol which included: vancomycin, zosyn and  provided IV fluid resuscitation. Urine and blood cultures in process.  2. RLL community acquire pneumonia. On admission fever 103 noted, but he has since been afebrile. A chest x-ray performed   revealed consolidation involving the right lower lobe. Continue zosyn and vancomycin. Follow-up on blood cultures. Repeat CXR revealed persistent pneumonia. With high fever, will check influenza panel 3. Metabolic encephalopathy, resolved. Patient presented with some confusion, reportedly had difficulties providing history. This was likely a consequence of sepsis. He appears to be a baseline.  4. Hypotension. Patient has history of hypertension. He presented with sepsis having soft blood pressures in the emergency department (last blood pressure 109/65). Metoprolol, Imdur or, hydrochlorothiazide held due to concerns for precipitating hypotension in this patient. 5. DM Type 2. Continue SSI. 6. Thrombocytopenia likely related to sepsis. Continue to monitor. 7. Acute kidney injury.On admission Creatinine of 1.25. Improving with IVF.  8. Coronary artery disease. He denies chest pain or shortness of breath. EKG showing AV paced rhythm. Continue Lipitor 40 mg by mouth daily and aspirin 81 mg by mouth daily, holding ARB and beta blocker for now.    Code Status: Full DVT prophylaxis: Lovenox Family Communication: Family bedside.  Disposition Plan: Move to medical bed. Anticipate discharge in 24 hours.    Consultants:  None  Procedures:  None  Antibiotics:  Vancomycin 02/12>>  Zosyn 02/12>>  HPI/Subjective: Doing well. Breathing is  fine. Denies sick contact and reports only had fever yesterday. Leg swelling is chronic.   Objective: Filed Vitals:   07/12/15 0515 07/12/15 0600  BP: 138/79 150/84  Pulse: 60 60  Temp:    Resp: 14 16    Intake/Output Summary (Last 24 hours) at 07/12/15 0702 Last data filed at 07/12/15 0600  Gross per 24 hour  Intake   2200 ml  Output    600 ml  Net   1600 ml   Filed Weights   07/11/15 1410 07/11/15 2024 07/12/15 0500  Weight: 81.647 kg (180 lb) 85.1 kg (187 lb 9.8 oz) 85.1 kg (187 lb 9.8 oz)    Exam:  General: NAD. Appears calm and looks comfortable, sitting in room chair.  Cardiovascular: RRR, S1, S2  Respiratory: Crackles at bases.  Abdomen: soft, non tender, no distention , bowel sounds normal Musculoskeletal: 1+edema  bilaterally   Data Reviewed: Basic Metabolic Panel:  Recent Labs Lab 07/11/15 1456 07/11/15 2203 07/12/15 0457  NA 133* 133* 132*  K 3.5 3.5 3.6  CL 97* 98* 100*  CO2 26 26 25   GLUCOSE 193* 202* 166*  BUN 18 16 17   CREATININE 1.25* 1.23 1.14  CALCIUM 8.5* 8.0* 7.9*   Liver Function Tests:  Recent Labs Lab 07/11/15 1456 07/11/15 2203  AST 20 20  ALT 26 23  ALKPHOS 48 43  BILITOT 1.4* 2.1*  PROT 6.2* 5.6*  ALBUMIN 3.6 3.2*   CBC:  Recent Labs Lab 07/11/15 1456 07/11/15 2203 07/12/15 0457  WBC 10.4 10.8* 8.5  NEUTROABS 9.2* 8.9*  --   HGB 11.4* 10.8* 10.4*  HCT 31.8* 30.1* 29.5*  MCV 91.6 92.6 93.1  PLT 119* 111* 106*   CBG:  Recent Labs Lab 07/11/15 2034 07/12/15 0003  GLUCAP  159* 177*    Recent Results (from the past 240 hour(s))  Blood Culture (routine x 2)     Status: None (Preliminary result)   Collection Time: 07/11/15  2:56 PM  Result Value Ref Range Status   Specimen Description BLOOD LEFT FOREARM  Final   Special Requests BOTTLES DRAWN AEROBIC AND ANAEROBIC [facility] EACH  Final   Culture PENDING  Incomplete   Report Status PENDING  Incomplete  Blood Culture (routine x 2)     Status: None (Preliminary  result)   Collection Time: 07/11/15  3:00 PM  Result Value Ref Range Status   Specimen Description BLOOD RIGHT FOREARM  Final   Special Requests BOTTLES DRAWN AEROBIC AND ANAEROBIC [facility] EACH  Final   Culture PENDING  Incomplete   Report Status PENDING  Incomplete  MRSA PCR Screening     Status: None   Collection Time: 07/11/15  8:24 PM  Result Value Ref Range Status   MRSA by PCR NEGATIVE NEGATIVE Final    Comment:        The GeneXpert MRSA Assay (FDA approved for NASAL specimens only), is one component of a comprehensive MRSA colonization surveillance program. It is not intended to diagnose MRSA infection nor to guide or monitor treatment for MRSA infections.      Studies: Dg Chest Port 1 View  07/11/2015  CLINICAL DATA:  Cough and fever EXAM: PORTABLE CHEST 1 VIEW COMPARISON:  Chest radiograph March 13, 2014; chest CT March 25, 2014 FINDINGS: There is airspace consolidation in the right base. Lungs elsewhere clear. Heart is mildly enlarged with pulmonary vascularity within normal limits. Pacemaker leads are attached to the right atrium, right ventricle, and left ventricle. Patient is status post coronary artery bypass grafting. No adenopathy. IMPRESSION: Right base airspace consolidation consistent with pneumonia. Lungs elsewhere clear. Stable cardiac enlargement. Followup PA and lateral chest radiographs recommended in 3-4 weeks following trial of antibiotic therapy to ensure resolution and exclude underlying malignancy. Electronically Signed   By: Lowella Grip III M.D.   On: 07/11/2015 15:31    Scheduled Meds: . aspirin EC  81 mg Oral Daily  . atorvastatin  40 mg Oral QHS  . enoxaparin (LOVENOX) injection  40 mg Subcutaneous Q24H  . insulin aspart  0-15 Units Subcutaneous 6 times per day  . piperacillin-tazobactam (ZOSYN)  IV  3.375 g Intravenous 3 times per day  . sodium chloride flush  3 mL Intravenous Q12H  . vancomycin  750 mg Intravenous Q12H   Continuous  Infusions: . sodium chloride 75 mL/hr at 07/12/15 0600    Principal Problem:   Sepsis ([place]) Active Problems:   CAD (coronary artery disease)   DM2 (diabetes mellitus, type 2) ([facility])   HTN (hypertension)   CAP (community acquired pneumonia)   Acute encephalopathy    Time spent: 25 minutes     Kathie Dike, MD  [facility] Pager [phone]. If 7PM-7AM, please contact night-coverage at [email or web address], password [facility] 07/12/2015, 7:02 AM  LOS: 1 day     By signing my name below, I, Rennis Harding, attest that this documentation has been prepared under the direction and in the presence of Kathie Dike, MD. Electronically signed: Rennis Harding, Scribe. 07/12/2015 9:40am   I, Dr. Kathie Dike, personally performed the services described in this documentaiton. All medical record entries made by the scribe were at my direction and in my presence. I have reviewed the chart and agree that the record reflects my personal performance and is accurate and complete  Kathie Dike, MD, 07/12/2015 9:59 AM

## 2015-07-13 DIAGNOSIS — I10 Essential (primary) hypertension: Secondary | ICD-10-CM

## 2015-07-13 LAB — GLUCOSE, CAPILLARY
GLUCOSE-CAPILLARY: 150 mg/dL — AB (ref 65–99)
GLUCOSE-CAPILLARY: 109 mg/dL — AB (ref 65–99)
Glucose-Capillary: 171 mg/dL — ABNORMAL HIGH (ref 65–99)
Glucose-Capillary: 215 mg/dL — ABNORMAL HIGH (ref 65–99)

## 2015-07-13 LAB — BASIC METABOLIC PANEL
ANION GAP: 8 (ref 5–15)
BUN: 14 mg/dL (ref 6–20)
CALCIUM: 8.5 mg/dL — AB (ref 8.9–10.3)
CO2: 24 mmol/L (ref 22–32)
Chloride: 98 mmol/L — ABNORMAL LOW (ref 101–111)
Creatinine, Ser: 0.97 mg/dL (ref 0.61–1.24)
GFR calc Af Amer: >60 mL/min (ref >60)
GFR calc non Af Amer: >60 mL/min (ref >60)
GLUCOSE: 155 mg/dL — AB (ref 65–99)
Potassium: 3.4 mmol/L — ABNORMAL LOW (ref 3.5–5.1)
Sodium: 130 mmol/L — ABNORMAL LOW (ref 135–145)

## 2015-07-13 LAB — CBC
HEMATOCRIT: 31.7 % — AB (ref 39.0–52.0)
Hemoglobin: 11.3 g/dL — ABNORMAL LOW (ref 13.0–17.0)
MCH: 33.1 pg (ref 26.0–34.0)
MCHC: 35.6 g/dL (ref 30.0–36.0)
MCV: 93 fL (ref 78.0–100.0)
Platelets: 124 10*3/uL — ABNORMAL LOW (ref 150–400)
RBC: 3.41 MIL/uL — ABNORMAL LOW (ref 4.22–5.81)
RDW: 13.4 % (ref 11.5–15.5)
WBC: 9.3 10*3/uL (ref 4.0–10.5)

## 2015-07-13 MED ORDER — AMOXICILLIN-POT CLAVULANATE 875-125 MG PO TABS: 1.0000 | ORAL_TABLET | Freq: Two times a day (BID) | ORAL | Status: DC

## 2015-07-13 MED ORDER — GUAIFENESIN ER 600 MG PO TB12: 600.0000 mg | ORAL_TABLET | Freq: Two times a day (BID) | ORAL | Status: DC

## 2015-07-13 NOTE — Progress Notes (Signed)
Pt discharged with prescriptions, all belongings, and IV removed and intact.

## 2015-07-13 NOTE — Discharge Summary (Signed)
Physician Discharge Summary  Dustin Vincent [id number] DOB: 11-19-1931 DOA: 07/11/2015  PCP: Wende Neighbors, MD  Admit date: 07/11/2015 Discharge date: 07/13/2015  Time spent: 35 minutes  Recommendations for Outpatient Follow-up:  1. Follow-up with PCP within 1-2 weeks    Discharge Diagnoses:  Principal Problem:   Sepsis ([place]) Active Problems:   CAD (coronary artery disease)   DM2 (diabetes mellitus, type 2) (HCC)   HTN (hypertension)   CAP (community acquired pneumonia)   Acute encephalopathy   Thrombocytopenia ([place])   Discharge Condition: Improved   Diet recommendation: Heart healthy   Filed Weights   07/11/15 1410 07/11/15 2024 07/12/15 0500  Weight: 81.647 kg (180 lb) 85.1 kg (187 lb 9.8 oz) 85.1 kg (187 lb 9.8 oz)    History of present illness:  58 yom with PMH of hypertension, coronary artery disease status post three-vessel CABG, status post AICD placement residing in the community presented with cough, increasing weakness, fevers and chills. While in the ED noted to be mildly confused and with a temp of 103. CXR revealed right base consolidation consistent with pneumonia, for which he was started on IV vancomycin and Zosyn. He was admitted for further treatment.   Hospital Course:  Patient found to be septic on admission evidence by a temperature of [id number], metabolic encephalopathy, respiratory rate of 27, with source of infection likely to be pneumonia. He was started sepsis protocol which included: vancomycin, zosyn and provided IV fluid resuscitation. Urine cultures with insignificant growth and blood cultures were unremarkable. Patient is now afebrile and hemodynamically stable. He will be transitioned to oral abx.  1. RLL community acquire pneumonia. On admission fever 103 noted, but he has since been afebrile. A chest x-ray performed revealed consolidation involving the right lower lobe. Provided IV zosyn and vancomycin, that on discharge transitioned to oral abx.  Repeat CXR revealed persistent pneumonia. Influenza panel negative. Blood cultures showed no growth.  2. Metabolic encephalopathy, resolved. Patient presented with some confusion, reportedly had difficulties providing history. This was likely a consequence of sepsis. He appears to be a baseline.  3. Hypotension. Patient has history of hypertension. He presented with sepsis having soft blood pressures in the emergency department (last blood pressure 109/65). Metoprolol, Imdur or, hydrochlorothiazide were held due to concerns for precipitating hypotension in this patient. Blood pressure have improved, will restart antihypertensives  on discharge.  4. DM Type 2. Continue SSI. Continue home regimen on discharge.  5. Thrombocytopenia likely related to sepsis. improving 6. Acute kidney injury.On admission Creatinine of 1.25. Improved with IVF.    Procedures:  None  Consultations:  None   Discharge Exam: Filed Vitals:   07/12/15 2101 07/13/15 0420  BP: 168/98 140/81  Pulse: 70 61  Temp: 98.7 F (37.1 C) 99.1 F (37.3 C)  Resp: 20 20    General: NAD, looks comfortable Cardiovascular: RRR, S1, S2  Respiratory: Crackles at left base Abdomen: soft, non tender, no distention , bowel sounds normal Musculoskeletal: 1+ edema b/l  Discharge Instructions   Discharge Instructions    Diet - low sodium heart healthy    Complete by:  As directed      Increase activity slowly    Complete by:  As directed           Current Discharge Medication List    START taking these medications   Details  amoxicillin-clavulanate (AUGMENTIN) 875-125 MG tablet Take 1 tablet by mouth 2 (two) times daily. Qty: 12 tablet, Refills: 0    guaiFENesin (  MUCINEX) 600 MG 12 hr tablet Take 1 tablet (600 mg total) by mouth 2 (two) times daily. Qty: 30 tablet, Refills: 0      CONTINUE these medications which have NOT CHANGED   Details  aspirin EC 81 MG tablet Take 81 mg by mouth daily.    atorvastatin  (LIPITOR) 80 MG tablet Take 40 mg by mouth at bedtime.    cetirizine (ZYRTEC) 10 MG tablet Take 10 mg by mouth daily.    dextromethorphan-guaiFENesin (MUCINEX DM) 30-600 MG 12hr tablet Take 1 tablet by mouth 2 (two) times daily.    ferrous sulfate 325 (65 FE) MG tablet Take 325 mg by mouth daily with breakfast.    fluticasone (FLONASE) 50 MCG/ACT nasal spray Place 1 spray into both nostrils daily.    furosemide (LASIX) 40 MG tablet Take 80 mg by mouth daily.    gabapentin (NEURONTIN) 300 MG capsule Take 300 mg by mouth 3 (three) times daily. Take one capsule by mouth at bedtime for 2 days, then take one capsule 2 times a day for 2 days, then take 1 capsule 3 times a day for 14 days, then take 2 capsules 3 times per day for chronic cough. Started on 02.02.17.    losartan (COZAAR) 100 MG tablet Take 100 mg by mouth daily.    metoprolol succinate (TOPROL-XL) 100 MG 24 hr tablet Take 50 mg by mouth daily. Take with or immediately following a meal.    oxyCODONE (OXY IR/ROXICODONE) 5 MG immediate release tablet Take 5 mg by mouth 2 (two) times daily as needed for severe pain.    promethazine (PHENERGAN) 25 MG tablet Take 12.5 mg by mouth every 6 (six) hours as needed for nausea or vomiting.    sennosides-docusate sodium (SENOKOT-S) 8.6-50 MG tablet Take 1 tablet by mouth daily.    spironolactone (ALDACTONE) 25 MG tablet Take 12.5 mg by mouth daily.     tamsulosin (FLOMAX) 0.4 MG CAPS capsule Take 0.4 mg by mouth daily after supper.    Multiple Vitamin (MULTIVITAMIN WITH MINERALS) TABS tablet Take 1 tablet by mouth daily.    saxagliptin HCl (ONGLYZA) 5 MG TABS tablet Take 5 mg by mouth daily.       Allergies  Allergen Reactions  . Sulfa Antibiotics Rash    Was a salve      The results of significant diagnostics from this hospitalization (including imaging, microbiology, ancillary and laboratory) are listed below for reference.    Significant Diagnostic Studies: Dg Chest Port 1  View  07/12/2015  CLINICAL DATA:  Shortness of breath.  Recent diagnosis of pneumonia. EXAM: PORTABLE CHEST 1 VIEW COMPARISON:  PA and lateral chest 03/13/2014. Single view of the chest 07/11/2015. CT chest 03/25/2014. FINDINGS: Right basilar airspace disease seen on the most recent examination has improved. Subsegmental atelectasis is seen in the left lung base. The lungs are emphysematous. There is cardiomegaly. The patient is status post CABG with an AICD in place. IMPRESSION: Persistent but improved right lower lobe airspace disease compatible with pneumonia. Emphysema. Cardiomegaly. Electronically Signed   By: Inge Rise M.D.   On: 07/12/2015 07:19   Dg Chest Port 1 View  07/11/2015  CLINICAL DATA:  Cough and fever EXAM: PORTABLE CHEST 1 VIEW COMPARISON:  Chest radiograph March 13, 2014; chest CT March 25, 2014 FINDINGS: There is airspace consolidation in the right base. Lungs elsewhere clear. Heart is mildly enlarged with pulmonary vascularity within normal limits. Pacemaker leads are attached to the right atrium, right ventricle,  and left ventricle. Patient is status post coronary artery bypass grafting. No adenopathy. IMPRESSION: Right base airspace consolidation consistent with pneumonia. Lungs elsewhere clear. Stable cardiac enlargement. Followup PA and lateral chest radiographs recommended in 3-4 weeks following trial of antibiotic therapy to ensure resolution and exclude underlying malignancy. Electronically Signed   By: Lowella Grip III M.D.   On: 07/11/2015 15:31    Microbiology: Recent Results (from the past 240 hour(s))  Blood Culture (routine x 2)     Status: None (Preliminary result)   Collection Time: 07/11/15  2:56 PM  Result Value Ref Range Status   Specimen Description BLOOD LEFT FOREARM  Final   Special Requests BOTTLES DRAWN AEROBIC AND ANAEROBIC [place]  Final   Culture NO GROWTH 2 DAYS  Final   Report Status PENDING  Incomplete  Blood Culture (routine x 2)      Status: None (Preliminary result)   Collection Time: 07/11/15  3:00 PM  Result Value Ref Range Status   Specimen Description BLOOD RIGHT FOREARM  Final   Special Requests BOTTLES DRAWN AEROBIC AND ANAEROBIC [place]  Final   Culture NO GROWTH 2 DAYS  Final   Report Status PENDING  Incomplete  Urine culture     Status: None   Collection Time: 07/11/15  3:45 PM  Result Value Ref Range Status   Specimen Description URINE, CLEAN CATCH  Final   Special Requests NONE  Final   Culture   Final    2,000 COLONIES/mL INSIGNIFICANT GROWTH Performed at [facility]    Report Status 07/12/2015 FINAL  Final  MRSA PCR Screening     Status: None   Collection Time: 07/11/15  8:24 PM  Result Value Ref Range Status   MRSA by PCR NEGATIVE NEGATIVE Final    Comment:        The GeneXpert MRSA Assay (FDA approved for NASAL specimens only), is one component of a comprehensive MRSA colonization surveillance program. It is not intended to diagnose MRSA infection nor to guide or monitor treatment for MRSA infections.      Labs: Basic Metabolic Panel:  Recent Labs Lab 07/11/15 1456 07/11/15 2203 07/12/15 0457 07/13/15 0535  NA 133* 133* 132* 130*  K 3.5 3.5 3.6 3.4*  CL 97* 98* 100* 98*  CO2 26 26 25 24   GLUCOSE 193* 202* 166* 155*  BUN 18 16 17 14   CREATININE 1.25* 1.23 1.14 0.97  CALCIUM 8.5* 8.0* 7.9* 8.5*   Liver Function Tests:  Recent Labs Lab 07/11/15 1456 07/11/15 2203  AST 20 20  ALT 26 23  ALKPHOS 48 43  BILITOT 1.4* 2.1*  PROT 6.2* 5.6*  ALBUMIN 3.6 3.2*   CBC:  Recent Labs Lab 07/11/15 1456 07/11/15 2203 07/12/15 0457 07/13/15 0535  WBC 10.4 10.8* 8.5 9.3  NEUTROABS 9.2* 8.9*  --   --   HGB 11.4* 10.8* 10.4* 11.3*  HCT 31.8* 30.1* 29.5* 31.7*  MCV 91.6 92.6 93.1 93.0  PLT 119* 111* 106* 124*   CBG:  Recent Labs Lab 07/12/15 2034 07/13/15 0028 07/13/15 0417 07/13/15 0737 07/13/15 1132  GLUCAP 220* 215* 150* 109* 171*        Signed:  Kathie Dike, MD [facility] 07/13/2015, 1:00 PM    By signing my name below, I, Rennis Harding, attest that this documentation has been prepared under the direction and in the presence of Kathie Dike, MD. Electronically signed: Rennis Harding, Scribe. 07/13/2015 12:40pm   I, Dr. Kathie Dike, personally performed  the services described in this documentaiton. All medical record entries made by the scribe were at my direction and in my presence. I have reviewed the chart and agree that the record reflects my personal performance and is accurate and complete  Kathie Dike, MD, 07/13/2015 1:00 PM

## 2015-07-13 NOTE — Progress Notes (Signed)
Inpatient Diabetes Program Recommendations  AACE/ADA: New Consensus Statement on Inpatient Glycemic Control (2015)  Target Ranges:  Prepandial:   less than 140 mg/dL      Peak postprandial:   less than 180 mg/dL (1-2 hours)      Critically ill patients:  140 - 180 mg/dL   Review of Glycemic Control  Diabetes history: DM 2 Outpatient Diabetes medications: Onglyza 5 mg  Current orders for Inpatient glycemic control: moderate correction q 4 hrs  Inpatient Diabetes Program Recommendations:    Added carbohydrate modified to heart healthy orders with co-sign required. Presently eating 50% of meals per documentation. Once PO intake improved, please change correction scale to tidwc with HS correction scale.  Thank you Rosita Kea, RN, MSN, CDE  Diabetes Inpatient Program Office: [phone] Pager: [phone] 8:00 am to 5:00 pm

## 2015-07-13 NOTE — Care Management Note (Signed)
Case Management Note  Patient Details  Name: Dustin Vincent MRN: [id number] Date of Birth: 29-Aug-1931  Subjective/Objective:                  Pt admitted from home, lives alone and is ind with ADL's. [facility] needed. Pt has no HH or DME prior to admission. Pt goes to [place], [place] contacted (on 2/13) and made aware pt was admitted and DC anticiapated next next 24 hrs. Pt plans to return home with self care.   Action/Plan: Pt discharged home today. [facility] [place] made aware of DC and faxed DC summary. No further CM needs.   Expected Discharge Date:  07/14/15               Expected Discharge Plan:  Home/Self Care  In-House Referral:  NA  Discharge planning Services  CM Consult  Post Acute Care Choice:  NA Choice offered to:  NA  DME Arranged:    DME Agency:     HH Arranged:    HH Agency:     Status of Service:  Completed, signed off  Medicare Important Message Given:    Date Medicare IM Given:    Medicare IM give by:    Date Additional Medicare IM Given:    Additional Medicare Important Message give by:     If discussed at [place] of Stay Meetings, dates discussed:    Additional Comments:  Sherald Barge, RN 07/13/2015, 3:20 PM

## 2015-07-13 NOTE — Care Management Important Message (Signed)
Important Message  Patient Details  Name: ELESTER DUNMAN MRN: [id number] Date of Birth: [birth year]   Medicare Important Message Given:  N/A - LOS <3 / Initial given by admissions    Sherald Barge, RN 07/13/2015, 3:22 PM

## 2015-07-16 LAB — CULTURE, BLOOD (ROUTINE X 2)
Culture: NO GROWTH
Culture: NO GROWTH

## 2015-08-20 ENCOUNTER — Emergency Department (HOSPITAL_COMMUNITY): Payer: Medicare Other

## 2015-08-20 ENCOUNTER — Encounter (HOSPITAL_COMMUNITY): Payer: Self-pay | Admitting: *Deleted

## 2015-08-20 ENCOUNTER — Observation Stay (HOSPITAL_COMMUNITY)
Admission: EM | Admit: 2015-08-20 | Discharge: 2015-08-22 | Disposition: A | Discharge status: Home / Self Care | Payer: Medicare Other | Attending: Internal Medicine | Admitting: Internal Medicine

## 2015-08-20 DIAGNOSIS — R404 Transient alteration of awareness: Secondary | ICD-10-CM | POA: Diagnosis not present

## 2015-08-20 DIAGNOSIS — R7989 Other specified abnormal findings of blood chemistry: Secondary | ICD-10-CM | POA: Diagnosis not present

## 2015-08-20 DIAGNOSIS — I10 Essential (primary) hypertension: Secondary | ICD-10-CM | POA: Diagnosis not present

## 2015-08-20 DIAGNOSIS — E119 Type 2 diabetes mellitus without complications: Secondary | ICD-10-CM | POA: Diagnosis not present

## 2015-08-20 DIAGNOSIS — R062 Wheezing: Secondary | ICD-10-CM | POA: Diagnosis not present

## 2015-08-20 DIAGNOSIS — R55 Syncope and collapse: Principal | ICD-10-CM | POA: Diagnosis present

## 2015-08-20 DIAGNOSIS — M1990 Unspecified osteoarthritis, unspecified site: Secondary | ICD-10-CM | POA: Diagnosis not present

## 2015-08-20 DIAGNOSIS — Z87891 Personal history of nicotine dependence: Secondary | ICD-10-CM | POA: Diagnosis not present

## 2015-08-20 DIAGNOSIS — D696 Thrombocytopenia, unspecified: Secondary | ICD-10-CM | POA: Diagnosis present

## 2015-08-20 DIAGNOSIS — R778 Other specified abnormalities of plasma proteins: Secondary | ICD-10-CM

## 2015-08-20 DIAGNOSIS — E785 Hyperlipidemia, unspecified: Secondary | ICD-10-CM | POA: Insufficient documentation

## 2015-08-20 DIAGNOSIS — E871 Hypo-osmolality and hyponatremia: Secondary | ICD-10-CM | POA: Diagnosis not present

## 2015-08-20 DIAGNOSIS — D649 Anemia, unspecified: Secondary | ICD-10-CM | POA: Diagnosis present

## 2015-08-20 DIAGNOSIS — J101 Influenza due to other identified influenza virus with other respiratory manifestations: Secondary | ICD-10-CM | POA: Diagnosis present

## 2015-08-20 DIAGNOSIS — I2510 Atherosclerotic heart disease of native coronary artery without angina pectoris: Secondary | ICD-10-CM | POA: Diagnosis not present

## 2015-08-20 DIAGNOSIS — R05 Cough: Secondary | ICD-10-CM | POA: Diagnosis not present

## 2015-08-20 LAB — CBC
HEMATOCRIT: 33.1 % — AB (ref 39.0–52.0)
HEMOGLOBIN: 11.7 g/dL — AB (ref 13.0–17.0)
MCH: 32.9 pg (ref 26.0–34.0)
MCHC: 35.3 g/dL (ref 30.0–36.0)
MCV: 93 fL (ref 78.0–100.0)
PLATELETS: 130 10*3/uL — AB (ref 150–400)
RBC: 3.56 MIL/uL — AB (ref 4.22–5.81)
RDW: 13.9 % (ref 11.5–15.5)
WBC: 6.1 10*3/uL (ref 4.0–10.5)

## 2015-08-20 LAB — URINALYSIS, ROUTINE W REFLEX MICROSCOPIC
BILIRUBIN URINE: NEGATIVE
GLUCOSE, UA: NEGATIVE mg/dL
KETONES UR: NEGATIVE mg/dL
Leukocytes, UA: NEGATIVE
Nitrite: NEGATIVE
PROTEIN: NEGATIVE mg/dL
Specific Gravity, Urine: 1.005 (ref 1.005–1.030)
pH: 7 (ref 5.0–8.0)

## 2015-08-20 LAB — COMPREHENSIVE METABOLIC PANEL
ALT: 20 U/L (ref 17–63)
ANION GAP: 8 (ref 5–15)
AST: 23 U/L (ref 15–41)
Albumin: 4 g/dL (ref 3.5–5.0)
Alkaline Phosphatase: 56 U/L (ref 38–126)
BUN: 14 mg/dL (ref 6–20)
CHLORIDE: 92 mmol/L — AB (ref 101–111)
CO2: 26 mmol/L (ref 22–32)
Calcium: 8.5 mg/dL — ABNORMAL LOW (ref 8.9–10.3)
Creatinine, Ser: 1.34 mg/dL — ABNORMAL HIGH (ref 0.61–1.24)
GFR, EST AFRICAN AMERICAN: 56 mL/min — AB (ref >60)
GFR, EST NON AFRICAN AMERICAN: 48 mL/min — AB (ref >60)
Glucose, Bld: 183 mg/dL — ABNORMAL HIGH (ref 65–99)
POTASSIUM: 3.8 mmol/L (ref 3.5–5.1)
SODIUM: 126 mmol/L — AB (ref 135–145)
Total Bilirubin: 1.8 mg/dL — ABNORMAL HIGH (ref 0.3–1.2)
Total Protein: 6.6 g/dL (ref 6.5–8.1)

## 2015-08-20 LAB — BRAIN NATRIURETIC PEPTIDE: B Natriuretic Peptide: 1301 pg/mL — ABNORMAL HIGH (ref 0.0–100.0)

## 2015-08-20 LAB — URINE MICROSCOPIC-ADD ON

## 2015-08-20 LAB — GLUCOSE, CAPILLARY
GLUCOSE-CAPILLARY: 174 mg/dL — AB (ref 65–99)
Glucose-Capillary: 189 mg/dL — ABNORMAL HIGH (ref 65–99)

## 2015-08-20 LAB — PROCALCITONIN

## 2015-08-20 LAB — TROPONIN I
Troponin I: 0.07 ng/mL — ABNORMAL HIGH (ref <0.031)
Troponin I: 0.06 ng/mL — ABNORMAL HIGH (ref <0.031)

## 2015-08-20 LAB — LACTIC ACID, PLASMA: Lactic Acid, Venous: 1.3 mmol/L (ref 0.5–2.0)

## 2015-08-20 LAB — MAGNESIUM: Magnesium: 1.4 mg/dL — ABNORMAL LOW (ref 1.7–2.4)

## 2015-08-20 MED ORDER — OXYCODONE HCL 5 MG PO TABS
5.0000 mg | ORAL_TABLET | Freq: Two times a day (BID) | ORAL | Status: DC | PRN
Start: 2015-08-20 — End: 2015-08-22

## 2015-08-20 MED ORDER — ADULT MULTIVITAMIN W/MINERALS CH
1.0000 | ORAL_TABLET | Freq: Every day | ORAL | Status: DC
  Administered 2015-08-20 – 2015-08-22 (×3): 1 via ORAL
  Filled 2015-08-20 (×3): qty 1

## 2015-08-20 MED ORDER — METOPROLOL SUCCINATE ER 50 MG PO TB24
50.0000 mg | ORAL_TABLET | Freq: Every day | ORAL | Status: DC
  Administered 2015-08-21 – 2015-08-22 (×2): 50 mg via ORAL
  Filled 2015-08-20 (×2): qty 1

## 2015-08-20 MED ORDER — TAMSULOSIN HCL 0.4 MG PO CAPS
0.4000 mg | ORAL_CAPSULE | Freq: Every day | ORAL | Status: DC
  Administered 2015-08-20 – 2015-08-21 (×2): 0.4 mg via ORAL
  Filled 2015-08-20 (×2): qty 1

## 2015-08-20 MED ORDER — ASPIRIN EC 81 MG PO TBEC: 81.0000 mg | DELAYED_RELEASE_TABLET | Freq: Every day | ORAL | Status: DC

## 2015-08-20 MED ORDER — ENOXAPARIN SODIUM 40 MG/0.4ML ~~LOC~~ SOLN
40.0000 mg | SUBCUTANEOUS | Status: DC
  Administered 2015-08-20 – 2015-08-21 (×2): 40 mg via SUBCUTANEOUS
  Filled 2015-08-20 (×2): qty 0.4

## 2015-08-20 MED ORDER — SODIUM CHLORIDE 0.9 % IV SOLN: 250.0000 mL | INTRAVENOUS | Status: DC | PRN

## 2015-08-20 MED ORDER — SODIUM CHLORIDE 0.9% FLUSH
3.0000 mL | Freq: Two times a day (BID) | INTRAVENOUS | Status: DC
  Administered 2015-08-20 – 2015-08-22 (×3): 3 mL via INTRAVENOUS

## 2015-08-20 MED ORDER — SODIUM CHLORIDE 0.9 % IV BOLUS (SEPSIS)
1000.0000 mL | Freq: Once | INTRAVENOUS | Status: AC
Start: 2015-08-20 — End: 2015-08-20
  Administered 2015-08-20: 1000 mL via INTRAVENOUS

## 2015-08-20 MED ORDER — INSULIN ASPART 100 UNIT/ML ~~LOC~~ SOLN
0.0000 [IU] | Freq: Three times a day (TID) | SUBCUTANEOUS | Status: DC
  Administered 2015-08-20: 2 [IU] via SUBCUTANEOUS
  Administered 2015-08-21: 1 [IU] via SUBCUTANEOUS
  Administered 2015-08-21: 3 [IU] via SUBCUTANEOUS
  Administered 2015-08-21 – 2015-08-22 (×2): 1 [IU] via SUBCUTANEOUS
  Administered 2015-08-22: 3 [IU] via SUBCUTANEOUS

## 2015-08-20 MED ORDER — BISACODYL 5 MG PO TBEC: 5.0000 mg | DELAYED_RELEASE_TABLET | Freq: Every day | ORAL | Status: DC | PRN

## 2015-08-20 MED ORDER — ACETAMINOPHEN 325 MG PO TABS
650.0000 mg | ORAL_TABLET | Freq: Four times a day (QID) | ORAL | Status: DC | PRN
  Administered 2015-08-20 – 2015-08-21 (×2): 650 mg via ORAL
  Filled 2015-08-20 (×2): qty 2

## 2015-08-20 MED ORDER — LORATADINE 10 MG PO TABS
10.0000 mg | ORAL_TABLET | Freq: Every day | ORAL | Status: DC
  Administered 2015-08-20 – 2015-08-22 (×3): 10 mg via ORAL
  Filled 2015-08-20 (×3): qty 1

## 2015-08-20 MED ORDER — ONDANSETRON HCL 4 MG PO TABS: 4.0000 mg | ORAL_TABLET | Freq: Four times a day (QID) | ORAL | Status: DC | PRN

## 2015-08-20 MED ORDER — ONDANSETRON HCL 4 MG/2ML IJ SOLN: 4.0000 mg | Freq: Four times a day (QID) | INTRAMUSCULAR | Status: DC | PRN

## 2015-08-20 MED ORDER — ATORVASTATIN CALCIUM 40 MG PO TABS
40.0000 mg | ORAL_TABLET | Freq: Every day | ORAL | Status: DC
  Administered 2015-08-20 – 2015-08-21 (×2): 40 mg via ORAL
  Filled 2015-08-20 (×2): qty 1

## 2015-08-20 MED ORDER — ACETAMINOPHEN 650 MG RE SUPP: 650.0000 mg | Freq: Four times a day (QID) | RECTAL | Status: DC | PRN

## 2015-08-20 MED ORDER — SODIUM CHLORIDE 0.9% FLUSH
3.0000 mL | Freq: Two times a day (BID) | INTRAVENOUS | Status: DC
  Administered 2015-08-21: 3 mL via INTRAVENOUS

## 2015-08-20 MED ORDER — SODIUM CHLORIDE 0.9% FLUSH: 3.0000 mL | INTRAVENOUS | Status: DC | PRN

## 2015-08-20 MED ORDER — POLYETHYLENE GLYCOL 3350 17 G PO PACK: 17.0000 g | PACK | Freq: Every day | ORAL | Status: DC | PRN

## 2015-08-20 MED ORDER — FLUTICASONE PROPIONATE 50 MCG/ACT NA SUSP
1.0000 | Freq: Every day | NASAL | Status: DC
  Administered 2015-08-20 – 2015-08-22 (×2): 1 via NASAL
  Filled 2015-08-20: qty 16

## 2015-08-20 MED ORDER — FERROUS SULFATE 325 (65 FE) MG PO TABS
325.0000 mg | ORAL_TABLET | Freq: Every day | ORAL | Status: DC
  Administered 2015-08-21 – 2015-08-22 (×2): 325 mg via ORAL
  Filled 2015-08-20 (×2): qty 1

## 2015-08-20 MED ORDER — ASPIRIN EC 81 MG PO TBEC
81.0000 mg | DELAYED_RELEASE_TABLET | Freq: Every day | ORAL | Status: DC
  Administered 2015-08-21 – 2015-08-22 (×2): 81 mg via ORAL
  Filled 2015-08-20 (×2): qty 1

## 2015-08-20 MED ORDER — SPIRONOLACTONE 25 MG PO TABS
12.5000 mg | ORAL_TABLET | Freq: Every day | ORAL | Status: DC
  Administered 2015-08-21 – 2015-08-22 (×2): 12.5 mg via ORAL
  Filled 2015-08-20 (×5): qty 1

## 2015-08-20 MED ORDER — FUROSEMIDE 80 MG PO TABS
80.0000 mg | ORAL_TABLET | Freq: Every day | ORAL | Status: DC
  Administered 2015-08-21 – 2015-08-22 (×2): 80 mg via ORAL
  Filled 2015-08-20 (×2): qty 1
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
## 2015-08-20 NOTE — ED Notes (Signed)
MD at bedside. 

## 2015-08-20 NOTE — ED Notes (Signed)
Pt was assisted to standing position with RN and MD assistance. Pt was a little slow getting up, but was able to stand and walk with assistance. Family at bedside reported his ambulation at this time was normal for the pt.

## 2015-08-20 NOTE — ED Notes (Signed)
Attempted to call report, RN unavailable.

## 2015-08-20 NOTE — ED Notes (Addendum)
I attempted Orthostatic Vitals on Dustin Vincent at 4:06pm, he was able to complete the lying and sitting portion of the vitals. When asked to stand he tried to get to the side of the bed to swing his feet off, that was a bit of a struggle for him. He reported that standing would be too hard for him. He could not do it!

## 2015-08-20 NOTE — ED Notes (Signed)
Pt brought in by [facility]. Pt was eating lunch and had syncopal episode and EMS was called. Pt was alert and oriented x1 upon EMS arrival. Hx COPD. Pt has pacemaker/defib. Pt reported to EMS that this has happened before but MD was unable to give reason for why it happened. VS WNL for EMS. CBG 177. EMS reports pt's EKG was showing double pacer spikes upon arrival on scene. Pt drowsy during triage, but responsive to verbal stimuli, oriented to self, place and situation, disoriented to time.
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
## 2015-08-20 NOTE — ED Notes (Signed)
Pt asked to give urine sample. He reports he is unable to at this time.

## 2015-08-21 ENCOUNTER — Observation Stay (HOSPITAL_COMMUNITY): Payer: Medicare Other

## 2015-08-21 DIAGNOSIS — R7989 Other specified abnormal findings of blood chemistry: Secondary | ICD-10-CM | POA: Diagnosis not present

## 2015-08-21 DIAGNOSIS — E118 Type 2 diabetes mellitus with unspecified complications: Secondary | ICD-10-CM

## 2015-08-21 DIAGNOSIS — J101 Influenza due to other identified influenza virus with other respiratory manifestations: Secondary | ICD-10-CM | POA: Diagnosis not present

## 2015-08-21 DIAGNOSIS — D696 Thrombocytopenia, unspecified: Secondary | ICD-10-CM

## 2015-08-21 DIAGNOSIS — R55 Syncope and collapse: Secondary | ICD-10-CM | POA: Diagnosis not present

## 2015-08-21 LAB — BASIC METABOLIC PANEL
ANION GAP: 9 (ref 5–15)
BUN: 15 mg/dL (ref 6–20)
CO2: 25 mmol/L (ref 22–32)
Calcium: 8.4 mg/dL — ABNORMAL LOW (ref 8.9–10.3)
Chloride: 96 mmol/L — ABNORMAL LOW (ref 101–111)
Creatinine, Ser: 1.28 mg/dL — ABNORMAL HIGH (ref 0.61–1.24)
GFR, EST AFRICAN AMERICAN: 59 mL/min — AB (ref >60)
GFR, EST NON AFRICAN AMERICAN: 51 mL/min — AB (ref >60)
GLUCOSE: 159 mg/dL — AB (ref 65–99)
POTASSIUM: 3.6 mmol/L (ref 3.5–5.1)
Sodium: 130 mmol/L — ABNORMAL LOW (ref 135–145)

## 2015-08-21 LAB — GLUCOSE, CAPILLARY
GLUCOSE-CAPILLARY: 148 mg/dL — AB (ref 65–99)
GLUCOSE-CAPILLARY: 230 mg/dL — AB (ref 65–99)
Glucose-Capillary: 138 mg/dL — ABNORMAL HIGH (ref 65–99)
Glucose-Capillary: 206 mg/dL — ABNORMAL HIGH (ref 65–99)

## 2015-08-21 LAB — INFLUENZA PANEL BY PCR (TYPE A & B)
H1N1 flu by pcr: NOT DETECTED
Influenza A By PCR: POSITIVE — AB
Influenza B By PCR: NEGATIVE

## 2015-08-21 LAB — HEMOGLOBIN A1C
Hgb A1c MFr Bld: 7.8 % — ABNORMAL HIGH (ref 4.8–5.6)
Mean Plasma Glucose: 177 mg/dL

## 2015-08-21 LAB — TROPONIN I
Troponin I: 0.08 ng/mL — ABNORMAL HIGH (ref <0.031)
Troponin I: 0.09 ng/mL — ABNORMAL HIGH (ref <0.031)

## 2015-08-21 LAB — ECHOCARDIOGRAM COMPLETE
HEIGHTINCHES: 71 in
WEIGHTICAEL: 3057.6 [oz_av]

## 2015-08-21 MED ORDER — GUAIFENESIN ER 600 MG PO TB12
1200.0000 mg | ORAL_TABLET | Freq: Two times a day (BID) | ORAL | Status: DC
  Administered 2015-08-21 – 2015-08-22 (×2): 1200 mg via ORAL
  Filled 2015-08-21 (×2): qty 2

## 2015-08-21 MED ORDER — IPRATROPIUM-ALBUTEROL 0.5-2.5 (3) MG/3ML IN SOLN
3.0000 mL | Freq: Four times a day (QID) | RESPIRATORY_TRACT | Status: DC
  Administered 2015-08-21 – 2015-08-22 (×3): 3 mL via RESPIRATORY_TRACT
  Filled 2015-08-21 (×3): qty 3

## 2015-08-21 MED ORDER — OSELTAMIVIR PHOSPHATE 75 MG PO CAPS
75.0000 mg | ORAL_CAPSULE | Freq: Two times a day (BID) | ORAL | Status: DC
  Administered 2015-08-21 – 2015-08-22 (×4): 75 mg via ORAL
  Filled 2015-08-21 (×4): qty 1

## 2015-08-21 NOTE — Progress Notes (Signed)
MD made aware of positive flu results.

## 2015-08-21 NOTE — Progress Notes (Signed)
*  PRELIMINARY RESULTS* Echocardiogram 2D Echocardiogram has been performed.  Leavy Cella 08/21/2015, 11:00 AM

## 2015-08-21 NOTE — Care Management Obs Status (Signed)
[facility] NOTIFICATION   Patient Details  Name: DENARDO HIGGS MRN: [id number] Date of Birth: [birth year]   Medicare Observation Status Notification Given:  Yes    Briant Sites, RN 08/21/2015, 4:05 PM

## 2015-08-21 NOTE — Progress Notes (Signed)
[facility] PROGRESS NOTE  Dustin Vincent [id number] DOB: 04/21/1932 DOA: 08/20/2015 PCP: Wende Neighbors, MD  Assessment/Plan: 1. Syncope. Possibly related to dehydration and fever. No loss of continence, tongue bite, or tonic-clonic activity. No focal neuro deficits. Continue to monitor on tele. Echo as below. Blood pressures stable, continue to monitor. 2. CAD. Doubt ACS without sxs or acute EKG changes. Initial troponin 0.07. Losartan being held, will resume when renal fxn stable. Continue to trend troponin. Continue statins, ASA, and metoprolol. 3. Chronic combined CHF. BNP 1301 on admission. EF 40-45%. Follow up with cardiologist. Continue outpatient dose of lasix 4. Hyponatremia. Chronic, likely secondary to apparent CHF.  Improved after bolus. 5. DM type 2. A1c 7.8. Pt reports using Onglyza at home. Check CBG with meals. Continue SSI 6. HTN. Stable. Continue metoprolol. Resume losartan when renal fxn stable. 7. Thrombocytopenia. Platelet 130 on admission. Appears at baseline. No sign of active blood loss at this time.  8. Influenza A. Start on tamiflu. Treat fevers supportively. Start on neb treatments.   Code Status: full DVT prophylaxis: Lovenox Family Communication: discussed with patient and family at the bedside Disposition Plan: discharge once improved   Consultants:  none  Procedures: ECHO:- Left ventricle: The cavity size was normal. Wall thickness was  increased in a pattern of moderate LVH. Systolic function was  mildly to moderately reduced. The estimated ejection fraction was  in the range of 40% to 45%. Diffuse hypokinesis. Regional wall  motion abnormalities cannot be excluded. Doppler parameters are  consistent with a reversible restrictive pattern, indicative of  decreased left ventricular diastolic compliance and/or increased  left atrial pressure (grade 3 diastolic dysfunction). - Aortic valve: Trileaflet; normal thickness, mildly  calcified  leaflets. - Aortic root: The aortic root was mildly dilated. - Mitral valve: There was mild to moderate regurgitation. Valve  area by pressure half-time: 2.12 cm^2. - Left atrium: The atrium was moderately to severely dilated. - Tricuspid valve: There was mild-moderate regurgitation. - Pulmonary arteries: Systolic pressure was moderately increased.   PA peak pressure: 55 mm Hg (S).  Antibiotics:  none  HPI/Subjective: No lightheadedness or dizziness, had fevers overnight. Short of breath today and wheezing  Objective: Filed Vitals:   08/20/15 1735 08/20/15 2056  BP: 168/90 103/59  Pulse: 86 67  Temp: 102.9 F (39.4 C) 98.8 F (37.1 C)  Resp: 20 20   No intake or output data in the 24 hours ending 08/21/15 0737 Filed Weights   08/20/15 1358 08/20/15 1735  Weight: 77.111 kg (170 lb) 86.682 kg (191 lb 1.6 oz)    Exam:  General: NAD, looks comfortable Cardiovascular: RRR, S1, S2  Respiratory: bilateral wheezes Abdomen: soft, non tender, no distention , bowel sounds normal Musculoskeletal: 1-2+ edema b/l Neuro: no focal deficits  Data Reviewed: Basic Metabolic Panel:  Recent Labs Lab 08/20/15 1450 08/20/15 1657 08/21/15 0440  NA 126*  --  130*  K 3.8  --  3.6  CL 92*  --  96*  CO2 26  --  25  GLUCOSE 183*  --  159*  BUN 14  --  15  CREATININE 1.34*  --  1.28*  CALCIUM 8.5*  --  8.4*  MG  --  1.4*  --    Liver Function Tests:  Recent Labs Lab 08/20/15 1450  AST 23  ALT 20  ALKPHOS 56  BILITOT 1.8*  PROT 6.6  ALBUMIN 4.0   No results for input(s): LIPASE, AMYLASE in the last 168 hours. No  results for input(s): AMMONIA in the last 168 hours. CBC:  Recent Labs Lab 08/20/15 1450  WBC 6.1  HGB 11.7*  HCT 33.1*  MCV 93.0  PLT 130*   Cardiac Enzymes:  Recent Labs Lab 08/20/15 1450 08/20/15 1657 08/20/15 2316 08/21/15 0440  TROPONINI 0.07* 0.06* 0.08* 0.09*   BNP (last 3 results)  Recent Labs  08/20/15 1657  BNP  1301.0*    ProBNP (last 3 results) No results for input(s): PROBNP in the last 8760 hours.  CBG:  Recent Labs Lab 08/20/15 1818 08/20/15 2056  GLUCAP 189* 174*    Recent Results (from the past 240 hour(s))  Culture, blood (routine x 2)     Status: None (Preliminary result)   Collection Time: 08/20/15  8:24 PM  Result Value Ref Range Status   Specimen Description RIGHT ANTECUBITAL  Final   Special Requests BOTTLES DRAWN AEROBIC AND ANAEROBIC 6CC  Final   Culture NO GROWTH < 12 HOURS  Final   Report Status PENDING  Incomplete  Culture, blood (routine x 2)     Status: None (Preliminary result)   Collection Time: 08/20/15  8:29 PM  Result Value Ref Range Status   Specimen Description BLOOD RIGHT ARM  Final   Special Requests BOTTLES DRAWN AEROBIC AND ANAEROBIC 6CC EACH  Final   Culture NO GROWTH < 12 HOURS  Final   Report Status PENDING  Incomplete     Studies: Dg Chest 2 View  08/20/2015  CLINICAL DATA:  Syncope with cough. EXAM: CHEST  2 VIEW COMPARISON:  07/12/2015. FINDINGS: AICD device with atrial, coronary sinus, and ventricular components unchanged. The heart is enlarged. Prior CABG. No active infiltrates or failure. No effusion or pneumothorax. Carotid atherosclerosis. Improved RIGHT base infiltrate. Advanced atherosclerosis of the aorta. Skeletal osteopenia. IMPRESSION: Cardiomegaly.  No active infiltrates or failure. Electronically Signed   By: Staci Righter M.D.   On: 08/20/2015 15:24   Ct Head Wo Contrast  08/20/2015  CLINICAL DATA:  Syncope with altered mental status EXAM: CT HEAD WITHOUT CONTRAST TECHNIQUE: Contiguous axial images were obtained from the base of the skull through the vertex without intravenous contrast. COMPARISON:  June 17, 2011 FINDINGS: There is mild diffuse atrophy. There is no intracranial mass, hemorrhage, extra-axial fluid collection, or midline shift. There is evidence of a prior infarct involving the anterior limb of the left external  capsule, stable. There is patchy small vessel disease in the centra semiovale bilaterally, stable. There is no new gray-white compartment lesion. No acute infarct evident. The bony calvarium appears intact. The mastoid air cells are clear. No intraorbital lesions apparent. There is leftward deviation of the nasal septum. There is debris in each external auditory canal. IMPRESSION: Atrophy with patchy periventricular small vessel disease. 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 No acute infarct. No hemorrhage or mass effect. There is probable cerumen in each external auditory canal. There is leftward deviation of the nasal septum. Electronically Signed   By: Lowella Grip III M.D.   On: 08/20/2015 14:50    Scheduled Meds: . aspirin EC  81 mg Oral Daily  . atorvastatin  40 mg Oral QHS  . enoxaparin (LOVENOX) injection  40 mg Subcutaneous Q24H  . ferrous sulfate  325 mg Oral Q breakfast  . fluticasone  1 spray Each Nare Daily  . furosemide  80 mg Oral Daily  . insulin aspart  0-9 Units Subcutaneous TID WC  . loratadine  10 mg Oral Daily  . metoprolol succinate  50 mg Oral Daily  . multivitamin with minerals  1 tablet Oral Daily  . sodium chloride flush  3 mL Intravenous Q12H  . sodium chloride flush  3 mL Intravenous Q12H  . spironolactone  12.5 mg Oral Daily  . tamsulosin  0.4 mg Oral QPC supper   Continuous Infusions:   Principal Problem:   Syncope Active Problems:   Anemia   CAD (coronary artery disease)   DM2 (diabetes mellitus, type 2) (HCC)   HTN (hypertension)   Thrombocytopenia (HCC)   Elevated troponin    Time spent: 25 minutes    [facility] Pager [phone]. If 7PM-7AM, please contact night-coverage at [email or web address], password [facility] 08/21/2015, 7:37 AM

## 2015-08-21 NOTE — Progress Notes (Signed)
MD made aware of patients elevated temp, tylenol given, new orders for flu swab.  Droplet precaution initiated.

## 2015-08-22 DIAGNOSIS — I10 Essential (primary) hypertension: Secondary | ICD-10-CM

## 2015-08-22 DIAGNOSIS — R55 Syncope and collapse: Secondary | ICD-10-CM | POA: Diagnosis not present

## 2015-08-22 DIAGNOSIS — E118 Type 2 diabetes mellitus with unspecified complications: Secondary | ICD-10-CM | POA: Diagnosis not present

## 2015-08-22 DIAGNOSIS — R7989 Other specified abnormal findings of blood chemistry: Secondary | ICD-10-CM | POA: Diagnosis not present

## 2015-08-22 LAB — GLUCOSE, CAPILLARY
GLUCOSE-CAPILLARY: 130 mg/dL — AB (ref 65–99)
Glucose-Capillary: 215 mg/dL — ABNORMAL HIGH (ref 65–99)

## 2015-08-22 LAB — PROCALCITONIN

## 2015-08-22 MED ORDER — GUAIFENESIN ER 600 MG PO TB12: 600.0000 mg | ORAL_TABLET | Freq: Two times a day (BID) | ORAL | Status: DC

## 2015-08-22 MED ORDER — ALBUTEROL SULFATE HFA 108 (90 BASE) MCG/ACT IN AERS: 2.0000 | INHALATION_SPRAY | Freq: Four times a day (QID) | RESPIRATORY_TRACT | Status: AC | PRN

## 2015-08-22 MED ORDER — IPRATROPIUM-ALBUTEROL 0.5-2.5 (3) MG/3ML IN SOLN: 3.0000 mL | RESPIRATORY_TRACT | Status: DC | PRN

## 2015-08-22 MED ORDER — OSELTAMIVIR PHOSPHATE 75 MG PO CAPS: 75.0000 mg | ORAL_CAPSULE | Freq: Two times a day (BID) | ORAL | Status: DC

## 2015-08-22 NOTE — Progress Notes (Signed)
Patient discharged home.  IV removed - WNL.  Reviewed DC instructions and medications.  Instructed to follow up with PCP.  No questions at this time, stable to DC home.  Assisted off unit via [facility] by staff.

## 2015-08-22 NOTE — Progress Notes (Signed)
Patient is wheezing more , neb treatment is making him jittery.

## 2015-08-22 NOTE — Discharge Summary (Signed)
Physician Discharge Summary  Dustin Vincent [id number] DOB: [birth year] DOA: 08/20/2015  PCP: Wende Neighbors, MD  Admit date: 08/20/2015 Discharge date: 08/22/2015  Time spent: 35 minutes  Recommendations for Outpatient Follow-up:  1. Follow up with PCP in 1-2 weeks 2. Will follow up with primary cardiologist in 1-2 weeks. 3. Will be discharged with medication for influenza as an outpatient.   Discharge Diagnoses:  Principal Problem:   Syncope Active Problems:   Anemia   CAD (coronary artery disease)   DM2 (diabetes mellitus, type 2) (HCC)   HTN (hypertension)   Thrombocytopenia (HCC)   Elevated troponin   Influenza A   Discharge Condition: improved  Diet recommendation: carb modified  Filed Weights   08/20/15 1358 08/20/15 1735  Weight: 77.111 kg (170 lb) 86.682 kg (191 lb 1.6 oz)    History of present illness:  34 yom with a hx of HTN, DM2, and CAD presented to the ED following a witnessed syncopal episode. Pt reported being in his usual health and was eating lunch when he suddenly slumped over. Bystanders believed initailly that the pt was aspirating or choking and a nearby PC attempted to clear his airways. He was unresponsive during this time, but began to respond to stimuli, though he was still confused and lethargic. No loss of continence, tonic-clonic activity, or apparent injury was noted. The patient's family described him having a similar event a few months prior that spontaneously resovled. The pt denied any cough, nausea, vomiting, or abd pain. He did admit to some loose stools recently.  While in the ED, he was afebrile, O2 sats wee adequate, and VSS. A head CT was performed and appeared negative for any acute abnormality. The patient remained hemodynaimcally stable, in the ED. EKG was normal but her troponin level was noted to be elevated. The patient receive a 1L NS bolus in the ER and was monitored with no apparent arrhythmia or recurrent syncope. He was admitted  to telemetry for for further management.   Hospital Course:  Pt presented to the ED after a witnessed syncopal episode. It was thought that his pacer could be responsible so he was admitted to telemetry. He did not have any significant findings on telemetry. Possibly related to dehydration and fever. No loss of continence, tongue bite, or tonic-clonic activity. No focal neuro deficits. He had a similar episode in the past when he was febrile with pneumonia. No further episodes in the hospital. Echo as below. Blood pressures stable, continue to monitor. 1. CAD. Doubt ACS without sxs or acute EKG changes. Initial troponin 0.07. Continue statins, ASA, and metoprolol. Follow up with primary cardiologist at the [facility]. 2. Chronic combined CHF. BNP 1301 on admission. EF 40-45%. Patient reports that he has been told about heart failure in the past. He has not had any chest pain or EKG changes. No obvious regional wall motion abnormalities on Echo. Follow up with primary cardiologist at the [facility]. Continue outpatient dose of lasix 3. Hyponatremia. Chronic, likely secondary to apparent CHF. Improved after bolus. 4. DM type 2. A1c 7.8. Continue home regimen on discharge 5. HTN. Stable. Continue metoprolol and losartan on discharge. 6. Thrombocytopenia. Platelet 130 on admission. Appears at baseline. No sign of active blood loss at this time. Possibly related to viral illness.  7. Influenza A. Continue Tamiflu. Fevers appear to be improving. No high fever spikes in 24 hours. Treat fevers supportively. Continue neb treatments.  Procedures:  ECHO:- Left ventricle: The cavity size was normal. Wall thickness  was  increased in a pattern of moderate LVH. Systolic function was  mildly to moderately reduced. The estimated ejection fraction was  in the range of 40% to 45%. Diffuse hypokinesis. Regional wall  motion abnormalities cannot be excluded. Doppler parameters are  consistent with a reversible restrictive  pattern, indicative of  decreased left ventricular diastolic compliance and/or increased  left atrial pressure (grade 3 diastolic dysfunction). - Aortic valve: Trileaflet; normal thickness, mildly calcified  leaflets. - Aortic root: The aortic root was mildly dilated. - Mitral valve: There was mild to moderate regurgitation. Valve  area by pressure half-time: 2.12 cm^2. - Left atrium: The atrium was moderately to severely dilated. - Tricuspid valve: There was mild-moderate regurgitation. - Pulmonary arteries: Systolic pressure was moderately increased. PA peak pressure: 55 mm Hg (S).  Consultations:  none  Discharge Exam: Filed Vitals:   08/22/15 0708 08/22/15 1312  BP: 144/80 117/67  Pulse: 68 60  Temp: 99.2 F (37.3 C) 99 F (37.2 C)  Resp: 20 20     General: NAD, looks comfortable  Cardiovascular: RRR, S1, S2   Respiratory: clear bilaterally with occasional rhonchi  Abdomen: soft, non tender, no distention , bowel sounds normal  Musculoskeletal: 1+edema b/l  Neuro: no focal deficits  Discharge Instructions    Discharge Medication List as of 08/22/2015  3:47 PM    START taking these medications   Details  albuterol (PROVENTIL HFA;VENTOLIN HFA) 108 (90 Base) MCG/ACT inhaler Inhale 2 puffs into the lungs every 6 (six) hours as needed for wheezing or shortness of breath., Starting 08/22/2015, Until Discontinued, Print    oseltamivir (TAMIFLU) 75 MG capsule Take 1 capsule (75 mg total) by mouth 2 (two) times daily., Starting 08/22/2015, Until Discontinued, Print      CONTINUE these medications which have CHANGED   Details  guaiFENesin (MUCINEX) 600 MG 12 hr tablet Take 1 tablet (600 mg total) by mouth 2 (two) times daily., Starting 08/22/2015, Until Discontinued, Print      CONTINUE these medications which have NOT CHANGED   Details  aspirin EC 81 MG tablet Take 81 mg by mouth daily., Until Discontinued, Historical Med    atorvastatin (LIPITOR) 80 MG  tablet Take 40 mg by mouth at bedtime., Until Discontinued, Historical Med    cetirizine (ZYRTEC) 10 MG tablet Take 10 mg by mouth daily., Until Discontinued, Historical Med    dextromethorphan-guaiFENesin (MUCINEX DM) 30-600 MG 12hr tablet Take 1 tablet by mouth 2 (two) times daily., Until Discontinued, Historical Med    ferrous sulfate 325 (65 FE) MG tablet Take 325 mg by mouth daily with breakfast., Until Discontinued, Historical Med    fluticasone (FLONASE) 50 MCG/ACT nasal spray Place 1 spray into both nostrils daily., Until Discontinued, Historical Med    furosemide (LASIX) 40 MG tablet Take 80 mg by mouth daily., Until Discontinued, Historical Med    gabapentin (NEURONTIN) 300 MG capsule Take 300 mg by mouth 3 (three) times daily. Take one capsule by mouth at bedtime for 2 days, then take one capsule 2 times a day for 2 days, then take 1 capsule 3 times a day for 14 days, then take 2 capsules 3 times per day for chronic cough. Started on  02.02.17., Until Discontinued, Historical Med    losartan (COZAAR) 100 MG tablet Take 100 mg by mouth daily., Until Discontinued, Historical Med    metoprolol succinate (TOPROL-XL) 100 MG 24 hr tablet Take 50 mg by mouth daily. Take with or immediately following a meal., Until  Discontinued, Historical Med    Multiple Vitamin (MULTIVITAMIN WITH MINERALS) TABS tablet Take 1 tablet by mouth daily., Until Discontinued, Historical Med    oxyCODONE (OXY IR/ROXICODONE) 5 MG immediate release tablet Take 5 mg by mouth 2 (two) times daily as needed for severe pain., Until Discontinued, Historical Med    promethazine (PHENERGAN) 25 MG tablet Take 12.5 mg by mouth every 6 (six) hours as needed for nausea or vomiting., Until Discontinued, Historical Med    saxagliptin HCl (ONGLYZA) 5 MG TABS tablet Take 5 mg by mouth daily., Until Discontinued, Historical Med    sennosides-docusate sodium (SENOKOT-S) 8.6-50 MG tablet Take 1 tablet by mouth daily., Until  Discontinued, Historical Med    spironolactone (ALDACTONE) 25 MG tablet Take 12.5 mg by mouth daily. , Until Discontinued, Historical Med    tamsulosin (FLOMAX) 0.4 MG CAPS capsule Take 0.4 mg by mouth daily after supper., Until Discontinued, Historical Med      STOP taking these medications     amoxicillin-clavulanate (AUGMENTIN) 875-125 MG tablet        Allergies  Allergen Reactions  . Sulfa Antibiotics Rash    Was a salve   Follow-up Information    Follow up with Wende Neighbors, MD In 1 week.   Specialty:  Internal Medicine   Contact information:   [place] [id number] [phone]        The results of significant diagnostics from this hospitalization (including imaging, microbiology, ancillary and laboratory) are listed below for reference.    Significant Diagnostic Studies: Dg Chest 2 View  08/20/2015  CLINICAL DATA:  Syncope with cough. EXAM: CHEST  2 VIEW COMPARISON:  07/12/2015. FINDINGS: AICD device with atrial, coronary sinus, and ventricular components unchanged. The heart is enlarged. Prior CABG. No active infiltrates or failure. No effusion or pneumothorax. Carotid atherosclerosis. Improved RIGHT base infiltrate. Advanced atherosclerosis of the aorta. Skeletal osteopenia. IMPRESSION: Cardiomegaly.  No active infiltrates or failure. Electronically Signed   By: Staci Righter M.D.   On: 08/20/2015 15:24   Ct Head Wo Contrast  08/20/2015  CLINICAL DATA:  Syncope with altered mental status EXAM: CT HEAD WITHOUT CONTRAST TECHNIQUE: Contiguous axial images were obtained from the base of the skull through the vertex without intravenous contrast. COMPARISON:  June 17, 2011 FINDINGS: There is mild diffuse atrophy. There is no intracranial mass, hemorrhage, extra-axial fluid collection, or midline shift. There is evidence of a prior infarct involving the anterior limb of the left external capsule, stable. There is patchy small vessel disease in the centra  semiovale bilaterally, stable. There is no new gray-white compartment lesion. No acute infarct evident. The bony calvarium appears intact. The mastoid air cells are clear. No intraorbital lesions apparent. There is leftward deviation of the nasal septum. There is debris in each external auditory canal. IMPRESSION: Atrophy with patchy periventricular small vessel disease. 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 No acute infarct. No hemorrhage or mass effect. There is probable cerumen in each external auditory canal. There is leftward deviation of the nasal septum. Electronically Signed   By: Lowella Grip III M.D.   On: 08/20/2015 14:50    Microbiology: Recent Results (from the past 240 hour(s))  Culture, blood (routine x 2)     Status: None (Preliminary result)   Collection Time: 08/20/15  8:24 PM  Result Value Ref Range Status   Specimen Description RIGHT ANTECUBITAL  Final   Special Requests BOTTLES DRAWN AEROBIC AND ANAEROBIC [facility]  Final  Culture NO GROWTH < 12 HOURS  Final   Report Status PENDING  Incomplete  Culture, blood (routine x 2)     Status: None (Preliminary result)   Collection Time: 08/20/15  8:29 PM  Result Value Ref Range Status   Specimen Description BLOOD RIGHT ARM  Final   Special Requests BOTTLES DRAWN AEROBIC AND ANAEROBIC 6CC EACH  Final   Culture NO GROWTH < 12 HOURS  Final   Report Status PENDING  Incomplete     Labs: Basic Metabolic Panel:  Recent Labs Lab 08/20/15 1450 08/20/15 1657 08/21/15 0440  NA 126*  --  130*  K 3.8  --  3.6  CL 92*  --  96*  CO2 26  --  25  GLUCOSE 183*  --  159*  BUN 14  --  15  CREATININE 1.34*  --  1.28*  CALCIUM 8.5*  --  8.4*  MG  --  1.4*  --    Liver Function Tests:  Recent Labs Lab 08/20/15 1450  AST 23  ALT 20  ALKPHOS 56  BILITOT 1.8*  PROT 6.6  ALBUMIN 4.0   No results for input(s): LIPASE, AMYLASE in the last 168 hours. No results for input(s): AMMONIA in the last 168  hours. CBC:  Recent Labs Lab 08/20/15 1450  WBC 6.1  HGB 11.7*  HCT 33.1*  MCV 93.0  PLT 130*   Cardiac Enzymes:  Recent Labs Lab 08/20/15 1450 08/20/15 1657 08/20/15 2316 08/21/15 0440  TROPONINI 0.07* 0.06* 0.08* 0.09*   BNP: BNP (last 3 results)  Recent Labs  08/20/15 1657  BNP 1301.0*    ProBNP (last 3 results) No results for input(s): PROBNP in the last 8760 hours.  CBG:  Recent Labs Lab 08/21/15 1202 08/21/15 1706 08/21/15 2048 08/22/15 0726 08/22/15 1110  GLUCAP 230* 138* 206* 130* 215*    Signed:  Kathie Dike, MD. [facility] 08/22/2015, 6:23 PM

## 2015-08-24 LAB — RESPIRATORY VIRUS PANEL
Adenovirus: NEGATIVE
Influenza A: POSITIVE — AB
Influenza B: NEGATIVE
METAPNEUMOVIRUS: NEGATIVE
PARAINFLUENZA 1 A: NEGATIVE
PARAINFLUENZA 2 A: NEGATIVE
Parainfluenza 3: NEGATIVE
RHINOVIRUS: NEGATIVE
Respiratory Syncytial Virus A: NEGATIVE
Respiratory Syncytial Virus B: NEGATIVE

## 2015-08-25 LAB — CULTURE, BLOOD (ROUTINE X 2)
CULTURE: NO GROWTH
Culture: NO GROWTH

## 2015-08-27 DIAGNOSIS — E119 Type 2 diabetes mellitus without complications: Secondary | ICD-10-CM | POA: Diagnosis not present

## 2015-08-27 DIAGNOSIS — J449 Chronic obstructive pulmonary disease, unspecified: Secondary | ICD-10-CM | POA: Diagnosis not present

## 2015-08-27 DIAGNOSIS — I2510 Atherosclerotic heart disease of native coronary artery without angina pectoris: Secondary | ICD-10-CM | POA: Diagnosis not present

## 2015-09-07 DIAGNOSIS — S60312A Abrasion of left thumb, initial encounter: Secondary | ICD-10-CM | POA: Diagnosis not present

## 2015-10-01 DIAGNOSIS — E119 Type 2 diabetes mellitus without complications: Secondary | ICD-10-CM | POA: Diagnosis not present

## 2015-10-01 DIAGNOSIS — I10 Essential (primary) hypertension: Secondary | ICD-10-CM | POA: Diagnosis not present

## 2015-10-01 DIAGNOSIS — E782 Mixed hyperlipidemia: Secondary | ICD-10-CM | POA: Diagnosis not present

## 2015-10-04 DIAGNOSIS — E871 Hypo-osmolality and hyponatremia: Secondary | ICD-10-CM | POA: Diagnosis not present

## 2015-10-04 DIAGNOSIS — R05 Cough: Secondary | ICD-10-CM | POA: Diagnosis not present

## 2015-10-04 DIAGNOSIS — J449 Chronic obstructive pulmonary disease, unspecified: Secondary | ICD-10-CM | POA: Diagnosis not present

## 2015-10-04 DIAGNOSIS — E1165 Type 2 diabetes mellitus with hyperglycemia: Secondary | ICD-10-CM | POA: Diagnosis not present

## 2015-10-20 DIAGNOSIS — I10 Essential (primary) hypertension: Secondary | ICD-10-CM | POA: Diagnosis not present

## 2015-10-20 DIAGNOSIS — E119 Type 2 diabetes mellitus without complications: Secondary | ICD-10-CM | POA: Diagnosis not present

## 2015-10-20 DIAGNOSIS — E871 Hypo-osmolality and hyponatremia: Secondary | ICD-10-CM | POA: Diagnosis not present

## 2015-10-20 DIAGNOSIS — E1165 Type 2 diabetes mellitus with hyperglycemia: Secondary | ICD-10-CM | POA: Diagnosis not present

## 2015-11-08 DIAGNOSIS — Z85828 Personal history of other malignant neoplasm of skin: Secondary | ICD-10-CM | POA: Diagnosis not present

## 2015-11-08 DIAGNOSIS — L570 Actinic keratosis: Secondary | ICD-10-CM | POA: Diagnosis not present

## 2016-03-29 DIAGNOSIS — C44622 Squamous cell carcinoma of skin of right upper limb, including shoulder: Secondary | ICD-10-CM | POA: Diagnosis not present

## 2016-03-29 DIAGNOSIS — D485 Neoplasm of uncertain behavior of skin: Secondary | ICD-10-CM | POA: Diagnosis not present

## 2016-04-27 DIAGNOSIS — C44622 Squamous cell carcinoma of skin of right upper limb, including shoulder: Secondary | ICD-10-CM | POA: Diagnosis not present

## 2016-05-15 DIAGNOSIS — L570 Actinic keratosis: Secondary | ICD-10-CM | POA: Diagnosis not present

## 2016-05-15 DIAGNOSIS — C44329 Squamous cell carcinoma of skin of other parts of face: Secondary | ICD-10-CM | POA: Diagnosis not present

## 2016-05-15 DIAGNOSIS — D485 Neoplasm of uncertain behavior of skin: Secondary | ICD-10-CM | POA: Diagnosis not present

## 2016-05-15 DIAGNOSIS — Z85828 Personal history of other malignant neoplasm of skin: Secondary | ICD-10-CM | POA: Diagnosis not present

## 2016-06-01 DIAGNOSIS — C44319 Basal cell carcinoma of skin of other parts of face: Secondary | ICD-10-CM | POA: Diagnosis not present

## 2016-06-08 DIAGNOSIS — L720 Epidermal cyst: Secondary | ICD-10-CM | POA: Diagnosis not present

## 2016-06-08 DIAGNOSIS — D485 Neoplasm of uncertain behavior of skin: Secondary | ICD-10-CM | POA: Diagnosis not present

## 2016-06-22 DIAGNOSIS — Z4802 Encounter for removal of sutures: Secondary | ICD-10-CM | POA: Diagnosis not present

## 2016-09-01 DIAGNOSIS — R600 Localized edema: Secondary | ICD-10-CM | POA: Diagnosis not present

## 2016-09-01 DIAGNOSIS — M7981 Nontraumatic hematoma of soft tissue: Secondary | ICD-10-CM | POA: Diagnosis not present

## 2016-09-01 DIAGNOSIS — Z6826 Body mass index (BMI) 26.0-26.9, adult: Secondary | ICD-10-CM | POA: Diagnosis not present

## 2016-11-14 DIAGNOSIS — Z85828 Personal history of other malignant neoplasm of skin: Secondary | ICD-10-CM | POA: Diagnosis not present

## 2016-11-14 DIAGNOSIS — L570 Actinic keratosis: Secondary | ICD-10-CM | POA: Diagnosis not present

## 2017-03-29 IMAGING — CR DG CHEST 1V PORT
1 series · 1 of 1 positions shown · non-contrast
Comparison: PA and lateral chest 03/13/2014. Single view of the
chest 07/11/2015. CT chest 03/25/2014.

CLINICAL DATA: Shortness of breath.  Recent diagnosis of pneumonia.

EXAM:
PORTABLE CHEST 1 VIEW

[ap portable]
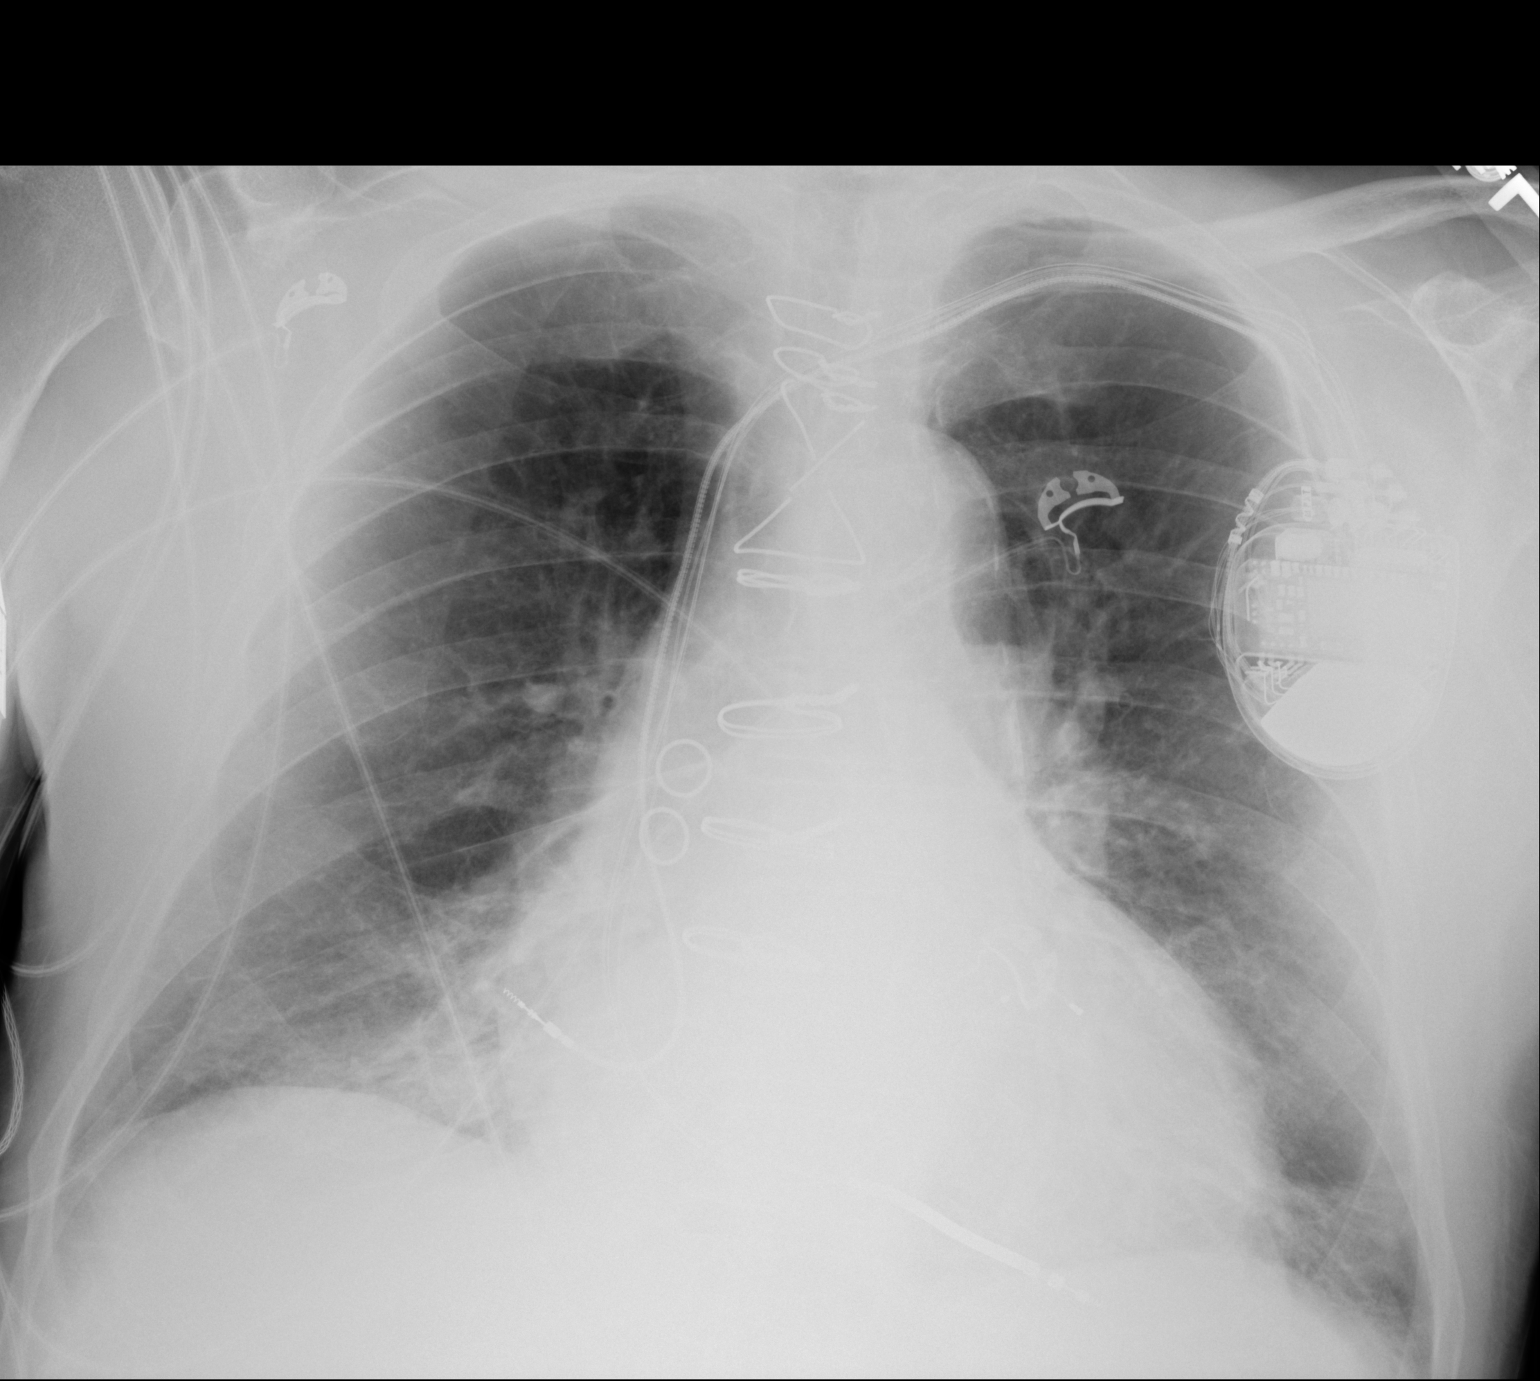

[1 of 1 positions shown; findings below may reference images not displayed]

FINDINGS: Right basilar airspace disease seen on the most recent examination
has improved. Subsegmental atelectasis is seen in the left lung
base. The lungs are emphysematous. There is cardiomegaly. The
patient is status post CABG with an AICD in place.
IMPRESSION: Persistent but improved right lower lobe airspace disease compatible
with pneumonia.

Emphysema.

Cardiomegaly.

## 2017-05-07 IMAGING — CT CT HEAD W/O CM
1 series · 15 of 30 positions shown, 19 images · non-contrast
Comparison: June 17, 2011

CLINICAL DATA: Syncope with altered mental status

EXAM:
CT HEAD WITHOUT CONTRAST
TECHNIQUE: Contiguous axial images were obtained from the base of the skull
through the vertex without intravenous contrast.

[Series 2: headtrauma 4.8 h37s · axial · 0.45mm/px · z∈[+144,+302]mm · 15 of 36 slices shown, 19 images]
[im 2/36  brain]
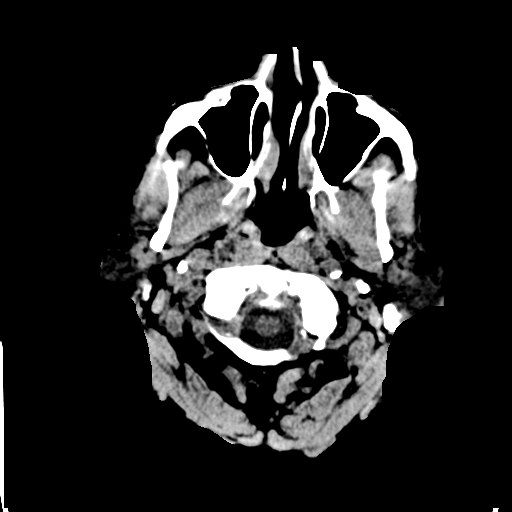
[im 2/36  bone]
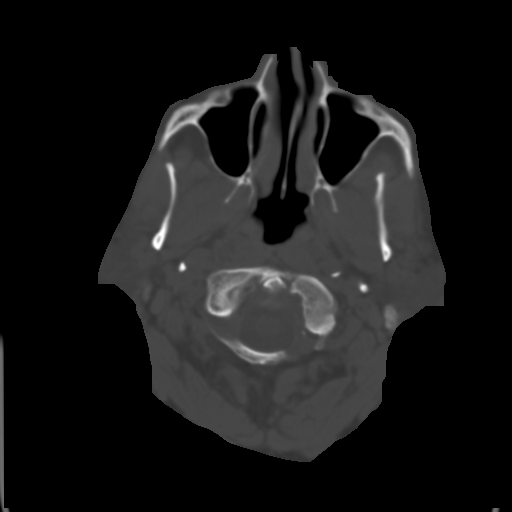
[im 4/36  brain]
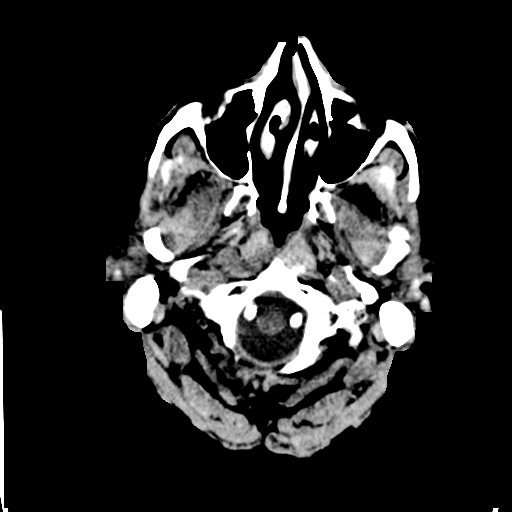
[im 7/36  brain]
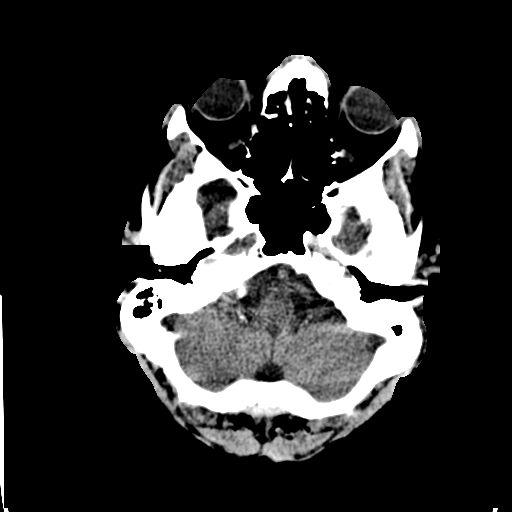
[im 9/36  brain]
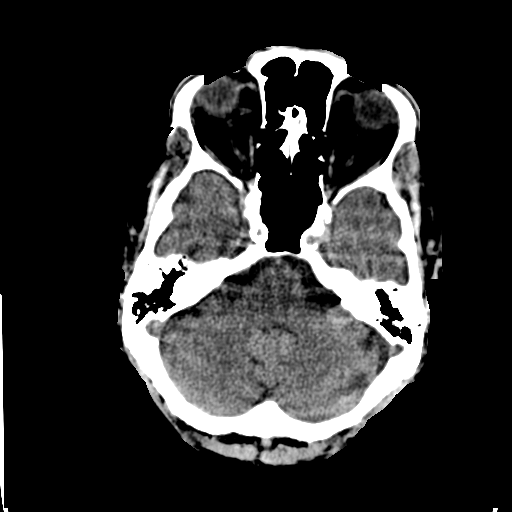
[im 11/36  brain]
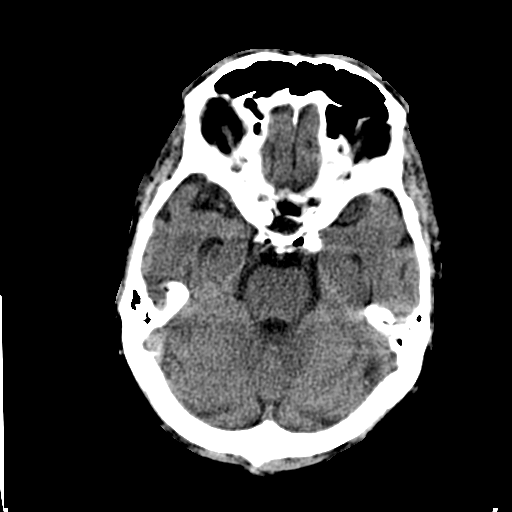
[im 11/36  bone]
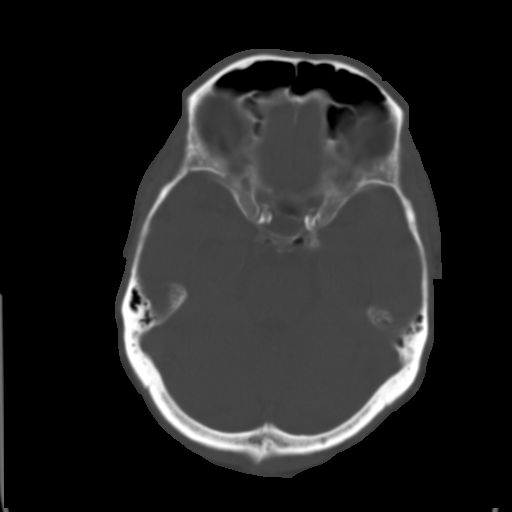
[im 14/36  brain]
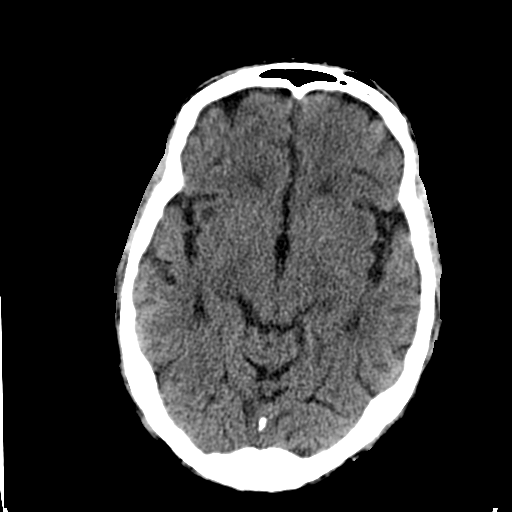
[im 16/36  brain]
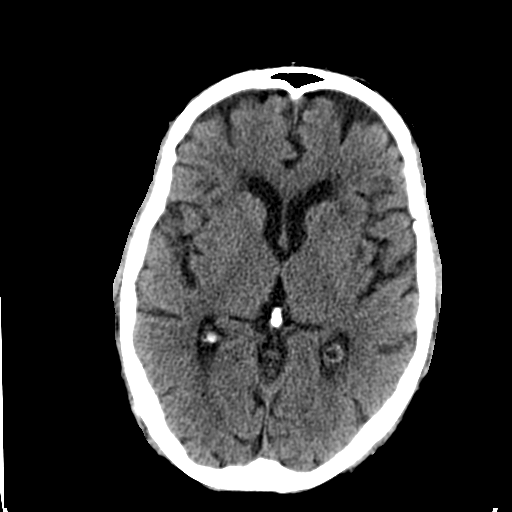
[im 19/36  brain]
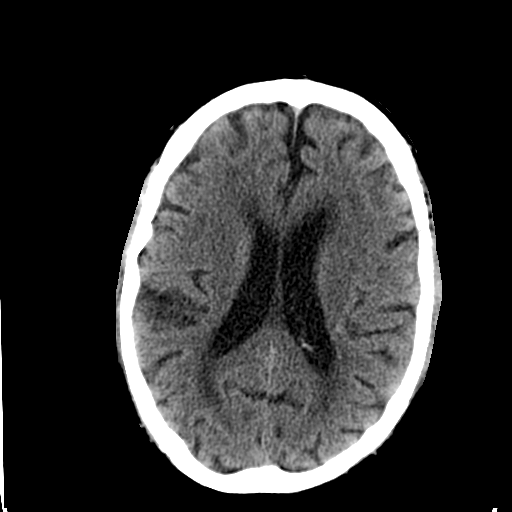
[im 20/36  brain]
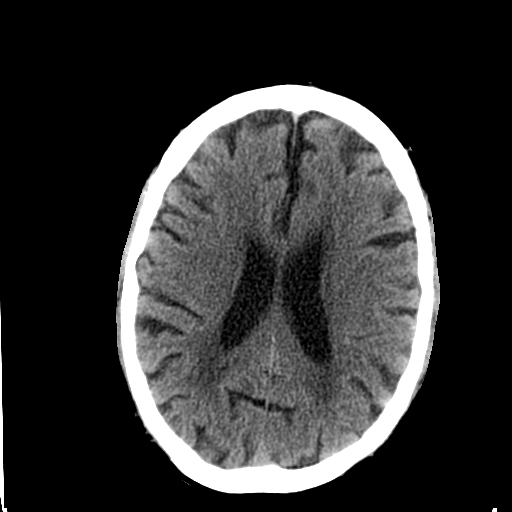
[im 20/36  bone]
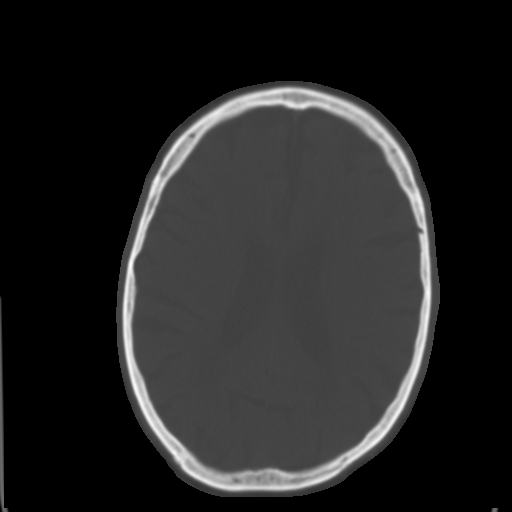
[im 22/36  brain]
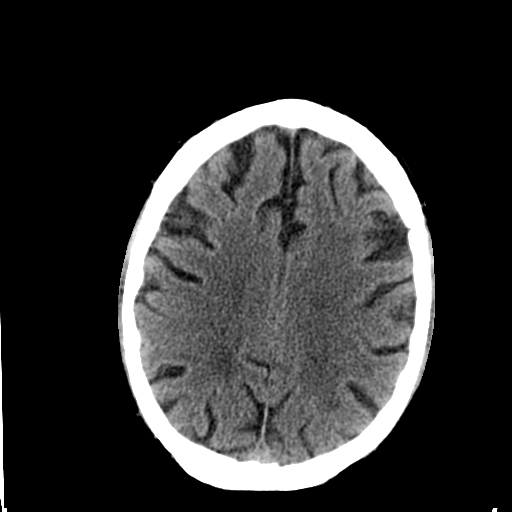
[im 25/36  brain]
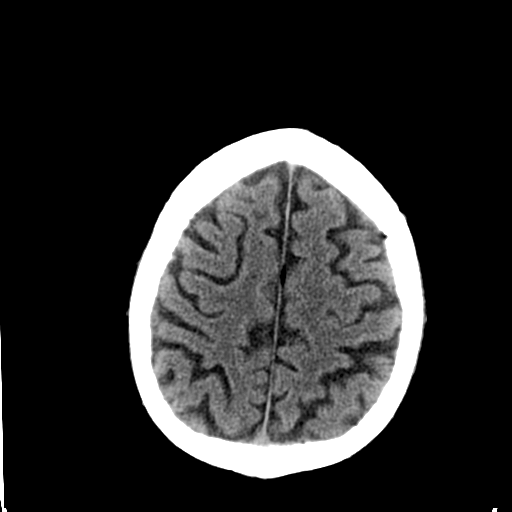
[im 27/36  brain]
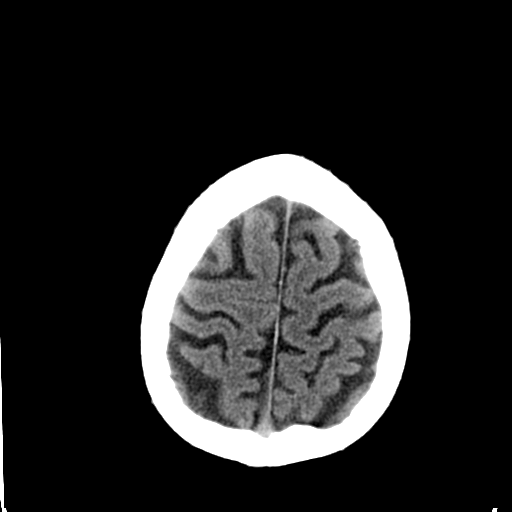
[im 29/36  brain]
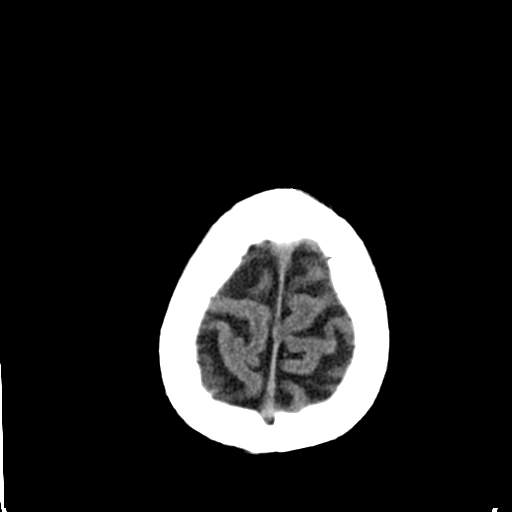
[im 29/36  bone]
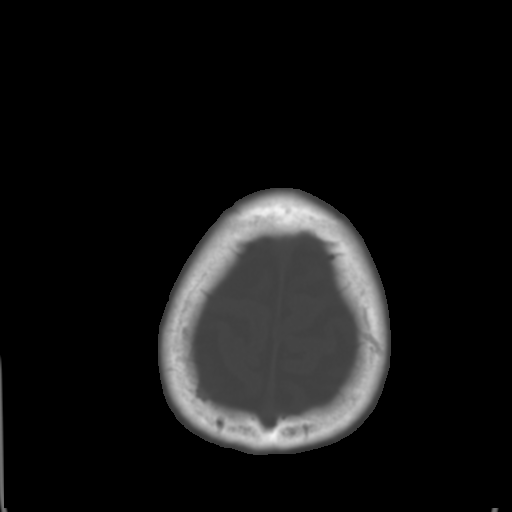
[im 32/36  brain]
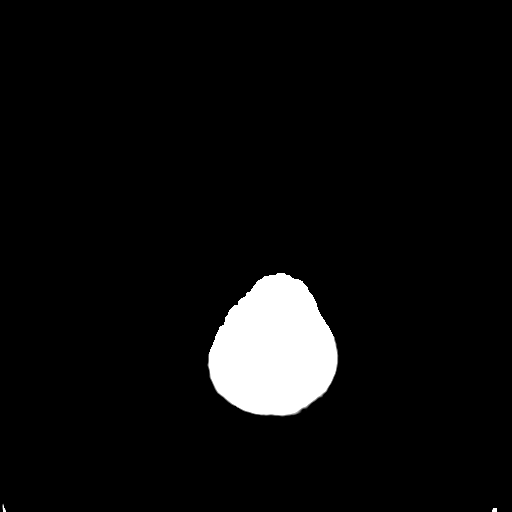
[im 34/36  brain]
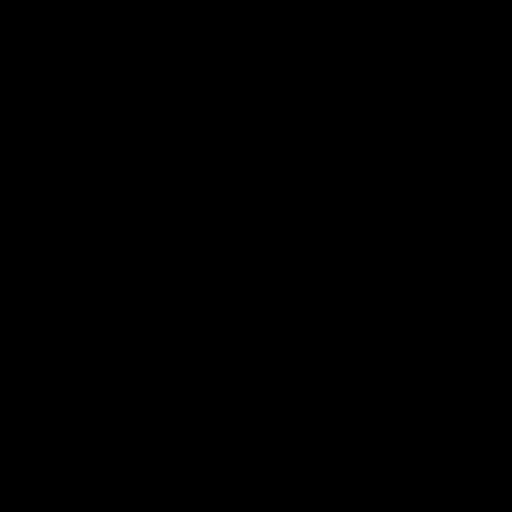

[15 of 30 positions shown; findings below may reference images not displayed]

FINDINGS: There is mild diffuse atrophy. There is no intracranial mass,
hemorrhage, extra-axial fluid collection, or midline shift. There is
evidence of a prior infarct involving the anterior limb of the left
external capsule, stable. There is patchy small vessel disease in
the centra semiovale bilaterally, stable. There is no new gray-white
compartment lesion. No acute infarct evident. The bony calvarium
appears intact. The mastoid air cells are clear. No intraorbital
lesions apparent. There is leftward deviation of the nasal septum.
There is debris in each external auditory canal.
IMPRESSION: Atrophy with patchy periventricular small vessel disease. Prior
small infarct anterior limb left external capsule. No acute infarct.
No hemorrhage or mass effect. There is probable cerumen in each
external auditory canal. There is leftward deviation of the nasal
septum.

## 2017-05-15 DIAGNOSIS — L739 Follicular disorder, unspecified: Secondary | ICD-10-CM | POA: Diagnosis not present

## 2017-05-15 DIAGNOSIS — L570 Actinic keratosis: Secondary | ICD-10-CM | POA: Diagnosis not present

## 2017-05-15 DIAGNOSIS — D485 Neoplasm of uncertain behavior of skin: Secondary | ICD-10-CM | POA: Diagnosis not present

## 2017-05-15 DIAGNOSIS — Z85828 Personal history of other malignant neoplasm of skin: Secondary | ICD-10-CM | POA: Diagnosis not present

## 2017-09-25 DIAGNOSIS — J441 Chronic obstructive pulmonary disease with (acute) exacerbation: Secondary | ICD-10-CM | POA: Diagnosis not present

## 2017-09-25 DIAGNOSIS — Z720 Tobacco use: Secondary | ICD-10-CM | POA: Diagnosis not present

## 2017-09-25 DIAGNOSIS — E1165 Type 2 diabetes mellitus with hyperglycemia: Secondary | ICD-10-CM | POA: Diagnosis not present

## 2017-09-25 DIAGNOSIS — I2510 Atherosclerotic heart disease of native coronary artery without angina pectoris: Secondary | ICD-10-CM | POA: Diagnosis not present

## 2017-09-25 DIAGNOSIS — J449 Chronic obstructive pulmonary disease, unspecified: Secondary | ICD-10-CM | POA: Diagnosis not present

## 2017-09-25 DIAGNOSIS — Z6825 Body mass index (BMI) 25.0-25.9, adult: Secondary | ICD-10-CM | POA: Diagnosis not present

## 2017-09-25 DIAGNOSIS — E871 Hypo-osmolality and hyponatremia: Secondary | ICD-10-CM | POA: Diagnosis not present

## 2017-09-25 DIAGNOSIS — J312 Chronic pharyngitis: Secondary | ICD-10-CM | POA: Diagnosis not present

## 2017-09-25 DIAGNOSIS — E119 Type 2 diabetes mellitus without complications: Secondary | ICD-10-CM | POA: Diagnosis not present

## 2017-09-25 DIAGNOSIS — Z716 Tobacco abuse counseling: Secondary | ICD-10-CM | POA: Diagnosis not present

## 2017-11-14 DIAGNOSIS — L739 Follicular disorder, unspecified: Secondary | ICD-10-CM | POA: Diagnosis not present

## 2017-11-14 DIAGNOSIS — D485 Neoplasm of uncertain behavior of skin: Secondary | ICD-10-CM | POA: Diagnosis not present

## 2017-11-14 DIAGNOSIS — L570 Actinic keratosis: Secondary | ICD-10-CM | POA: Diagnosis not present

## 2017-11-14 DIAGNOSIS — D235 Other benign neoplasm of skin of trunk: Secondary | ICD-10-CM | POA: Diagnosis not present

## 2017-11-14 DIAGNOSIS — Z85828 Personal history of other malignant neoplasm of skin: Secondary | ICD-10-CM | POA: Diagnosis not present

## 2018-01-22 DIAGNOSIS — D485 Neoplasm of uncertain behavior of skin: Secondary | ICD-10-CM | POA: Diagnosis not present

## 2018-01-22 DIAGNOSIS — C44229 Squamous cell carcinoma of skin of left ear and external auricular canal: Secondary | ICD-10-CM | POA: Diagnosis not present

## 2018-02-07 DIAGNOSIS — C44229 Squamous cell carcinoma of skin of left ear and external auricular canal: Secondary | ICD-10-CM | POA: Diagnosis not present

## 2018-05-07 DIAGNOSIS — J449 Chronic obstructive pulmonary disease, unspecified: Secondary | ICD-10-CM | POA: Diagnosis not present

## 2018-05-27 DIAGNOSIS — D235 Other benign neoplasm of skin of trunk: Secondary | ICD-10-CM | POA: Diagnosis not present

## 2018-05-27 DIAGNOSIS — L570 Actinic keratosis: Secondary | ICD-10-CM | POA: Diagnosis not present

## 2018-05-27 DIAGNOSIS — Z85828 Personal history of other malignant neoplasm of skin: Secondary | ICD-10-CM | POA: Diagnosis not present

## 2019-07-23 DIAGNOSIS — Z85828 Personal history of other malignant neoplasm of skin: Secondary | ICD-10-CM | POA: Diagnosis not present

## 2019-07-23 DIAGNOSIS — L570 Actinic keratosis: Secondary | ICD-10-CM | POA: Diagnosis not present

## 2019-07-23 DIAGNOSIS — L821 Other seborrheic keratosis: Secondary | ICD-10-CM | POA: Diagnosis not present

## 2019-10-05 DIAGNOSIS — R0989 Other specified symptoms and signs involving the circulatory and respiratory systems: Secondary | ICD-10-CM | POA: Diagnosis not present

## 2019-11-14 ENCOUNTER — Emergency Department (HOSPITAL_COMMUNITY)
Admission: EM | Admit: 2019-11-14 | Discharge: 2019-11-14 | Disposition: A | Discharge status: Home / Self Care | Payer: No Typology Code available for payment source | Attending: Emergency Medicine | Admitting: Emergency Medicine

## 2019-11-14 ENCOUNTER — Other Ambulatory Visit: Payer: Self-pay

## 2019-11-14 ENCOUNTER — Emergency Department (HOSPITAL_COMMUNITY): Payer: No Typology Code available for payment source

## 2019-11-14 ENCOUNTER — Encounter (HOSPITAL_COMMUNITY): Payer: Self-pay | Admitting: *Deleted

## 2019-11-14 DIAGNOSIS — R5383 Other fatigue: Secondary | ICD-10-CM | POA: Insufficient documentation

## 2019-11-14 DIAGNOSIS — Z5321 Procedure and treatment not carried out due to patient leaving prior to being seen by health care provider: Secondary | ICD-10-CM | POA: Diagnosis not present

## 2019-11-14 DIAGNOSIS — I959 Hypotension, unspecified: Secondary | ICD-10-CM | POA: Insufficient documentation

## 2019-11-14 LAB — CBC
HCT: 34.3 % — ABNORMAL LOW (ref 39.0–52.0)
Hemoglobin: 11.5 g/dL — ABNORMAL LOW (ref 13.0–17.0)
MCH: 33.3 pg (ref 26.0–34.0)
MCHC: 33.5 g/dL (ref 30.0–36.0)
MCV: 99.4 fL (ref 80.0–100.0)
Platelets: 144 10*3/uL — ABNORMAL LOW (ref 150–400)
RBC: 3.45 MIL/uL — ABNORMAL LOW (ref 4.22–5.81)
RDW: 14.3 % (ref 11.5–15.5)
WBC: 6 10*3/uL (ref 4.0–10.5)
nRBC: 0 % (ref 0.0–0.2)

## 2019-11-14 LAB — BASIC METABOLIC PANEL
Anion gap: 12 (ref 5–15)
BUN: 18 mg/dL (ref 8–23)
CO2: 26 mmol/L (ref 22–32)
Calcium: 9 mg/dL (ref 8.9–10.3)
Chloride: 96 mmol/L — ABNORMAL LOW (ref 98–111)
Creatinine, Ser: 1.83 mg/dL — ABNORMAL HIGH (ref 0.61–1.24)
GFR calc Af Amer: 38 mL/min — ABNORMAL LOW (ref >60)
GFR calc non Af Amer: 33 mL/min — ABNORMAL LOW (ref >60)
Glucose, Bld: 143 mg/dL — ABNORMAL HIGH (ref 70–99)
Potassium: 3.8 mmol/L (ref 3.5–5.1)
Sodium: 134 mmol/L — ABNORMAL LOW (ref 135–145)

## 2019-11-14 LAB — TROPONIN I (HIGH SENSITIVITY)
Troponin I (High Sensitivity): 57 ng/L — ABNORMAL HIGH (ref <18)
Troponin I (High Sensitivity): 49 ng/L — ABNORMAL HIGH (ref <18)

## 2019-11-14 MED ORDER — SODIUM CHLORIDE 0.9% FLUSH: 3.0000 mL | Freq: Once | INTRAVENOUS | Status: DC

## 2019-11-14 NOTE — ED Triage Notes (Signed)
States he had his defibrillator replaced yesterday. States his bp was 78 at home and he felt weak

## 2019-12-06 ENCOUNTER — Other Ambulatory Visit: Payer: Self-pay

## 2019-12-06 ENCOUNTER — Ambulatory Visit
Admission: EM | Admit: 2019-12-06 | Discharge: 2019-12-06 | Disposition: A | Discharge status: Home / Self Care | Payer: Medicare Other

## 2019-12-06 NOTE — ED Triage Notes (Signed)
Advised to go to the ER for further workup.  Verbalized understanding.

## 2021-04-01 ENCOUNTER — Ambulatory Visit (INDEPENDENT_AMBULATORY_CARE_PROVIDER_SITE_OTHER): Payer: No Typology Code available for payment source | Admitting: Podiatry

## 2021-04-01 ENCOUNTER — Other Ambulatory Visit: Payer: Self-pay

## 2021-04-01 DIAGNOSIS — R200 Anesthesia of skin: Secondary | ICD-10-CM | POA: Diagnosis not present

## 2021-04-01 DIAGNOSIS — R202 Paresthesia of skin: Secondary | ICD-10-CM | POA: Diagnosis not present

## 2021-04-01 DIAGNOSIS — M792 Neuralgia and neuritis, unspecified: Secondary | ICD-10-CM | POA: Diagnosis not present

## 2021-04-01 DIAGNOSIS — E1142 Type 2 diabetes mellitus with diabetic polyneuropathy: Secondary | ICD-10-CM

## 2021-04-01 NOTE — Progress Notes (Signed)
Subjective:  Patient ID: Dustin Vincent, male    DOB: [birth year],  MRN: [id number]  Chief Complaint  Patient presents with   Numbness    Feet are going numb     87 y.o. male presents with the above complaint.  Patient presents with complaint of bilateral numbness and tingling in the toes as well as the forefoot.  Patient states been going for quite some time is progressive gotten worse.  He is a diabetic he does not recall his last A1c however it is 7.8.  He would like to get this evaluated.  He denies any other acute complaints he is on gabapentin but he cannot tolerate it.  He has never tried Lyrica.   Review of Systems: Negative except as noted in the HPI. Denies N/V/F/Ch.  Past Medical History:  Diagnosis Date   Arthritis    Back pain    3 buldging disc   Blood transfusion 2008   Constipation    takes a stool softener daily   Coronary artery disease    Diabetes mellitus    takes Metformin 1000mg  bid   GERD (gastroesophageal reflux disease)    takes Omeprazole daily   History of blood clots    pt states a filter was placed   History of colon polyps    Hyperlipidemia    takes Zocor daily   Hypertension    Losartan,Isosorbide,and Metoprolol daily   Insomnia due to medical condition    Joint pain    Nocturia    Primary osteoarthritis of left hip 06/13/2011   Urinary frequency    Urinary urgency     Current Outpatient Medications:    albuterol (PROVENTIL HFA;VENTOLIN HFA) 108 (90 Base) MCG/ACT inhaler, Inhale 2 puffs into the lungs every 6 (six) hours as needed for wheezing or shortness of breath., Disp: 1 Inhaler, Rfl: 2   aspirin EC 81 MG tablet, Take 81 mg by mouth daily., Disp: , Rfl:    atorvastatin (LIPITOR) 80 MG tablet, Take 40 mg by mouth at bedtime., Disp: , Rfl:    cetirizine (ZYRTEC) 10 MG tablet, Take 10 mg by mouth daily., Disp: , Rfl:    dextromethorphan-guaiFENesin (MUCINEX DM) 30-600 MG 12hr tablet, Take 1 tablet by mouth 2 (two) times daily., Disp:  , Rfl:    ferrous sulfate 325 (65 FE) MG tablet, Take 325 mg by mouth daily with breakfast., Disp: , Rfl:    fluticasone (FLONASE) 50 MCG/ACT nasal spray, Place 1 spray into both nostrils daily., Disp: , Rfl:    furosemide (LASIX) 40 MG tablet, Take 80 mg by mouth daily., Disp: , Rfl:    gabapentin (NEURONTIN) 300 MG capsule, Take 300 mg by mouth 3 (three) times daily. Take one capsule by mouth at bedtime for 2 days, then take one capsule 2 times a day for 2 days, then take 1 capsule 3 times a day for 14 days, then take 2 capsules 3 times per day for chronic cough. Started on 02.02.17., Disp: , Rfl:    guaiFENesin (MUCINEX) 600 MG 12 hr tablet, Take 1 tablet (600 mg total) by mouth 2 (two) times daily., Disp: 30 tablet, Rfl: 0   losartan (COZAAR) 100 MG tablet, Take 100 mg by mouth daily., Disp: , Rfl:    metoprolol succinate (TOPROL-XL) 100 MG 24 hr tablet, Take 50 mg by mouth daily. Take with or immediately following a meal., Disp: , Rfl:    Multiple Vitamin (MULTIVITAMIN WITH MINERALS) TABS tablet, Take 1 tablet by mouth  daily., Disp: , Rfl:    oseltamivir (TAMIFLU) 75 MG capsule, Take 1 capsule (75 mg total) by mouth 2 (two) times daily., Disp: 10 capsule, Rfl: 0   oxyCODONE (OXY IR/ROXICODONE) 5 MG immediate release tablet, Take 5 mg by mouth 2 (two) times daily as needed for severe pain., Disp: , Rfl:    promethazine (PHENERGAN) 25 MG tablet, Take 12.5 mg by mouth every 6 (six) hours as needed for nausea or vomiting., Disp: , Rfl:    saxagliptin HCl (ONGLYZA) 5 MG TABS tablet, Take 5 mg by mouth daily., Disp: , Rfl:    sennosides-docusate sodium (SENOKOT-S) 8.6-50 MG tablet, Take 1 tablet by mouth daily., Disp: , Rfl:    spironolactone (ALDACTONE) 25 MG tablet, Take 12.5 mg by mouth daily. , Disp: , Rfl:    tamsulosin (FLOMAX) 0.4 MG CAPS capsule, Take 0.4 mg by mouth daily after supper., Disp: , Rfl:   Social History   Tobacco Use  Smoking Status Former   Packs/day: 0.00   Types:  Cigarettes  Smokeless Tobacco Never  Tobacco Comments   quit 4yrs ago    Allergies  Allergen Reactions   Sulfa Antibiotics Rash    Was a salve   Objective:  There were no vitals filed for this visit. There is no height or weight on file to calculate BMI. Constitutional Well developed. Well nourished.  Vascular Dorsalis pedis pulses palpable bilaterally. Posterior tibial pulses palpable bilaterally. Capillary refill normal to all digits.  No cyanosis or clubbing noted. Pedal hair growth normal.  Neurologic Normal speech. Oriented to person, place, and time. Diminished sensation to light touch grossly present bilaterally.  Dermatologic Nails thickened elongated dystrophic toenails x10.  Mild pain on palpation.  Numbness tingling secondary to diabetic neuropathy with underlying a controlled diabetes  Orthopedic: Normal joint ROM without pain or crepitus bilaterally. No visible deformities. No bony tenderness.   Radiographs: None Assessment:   1. Numbness and tingling   2. Neuropathic pain   3. Type 2 diabetes mellitus with diabetic polyneuropathy, without long-term current use of insulin ([place])    Plan:  Patient was evaluated and treated and all questions answered.  Numbness tingling secondary to diabetic neuropathy with underlying a not controlled diabetes -I explained to the patient the etiology of numbness tingling various treatment options were discussed.  I encouraged him to control his sugars and therefore the neuropathy will improve.  I discussed topical cream as well. -Also discussed transition from gabapentin to Lyrica to see if that is an improvement.  He states he will get that from his [place]. -Also discussed other over-the-counter neuropathic cream including applying Vicks VapoRub which may help.  Patient states understanding.  No follow-ups on file.

## 2021-06-24 ENCOUNTER — Other Ambulatory Visit: Payer: Self-pay

## 2021-06-24 ENCOUNTER — Encounter: Payer: Self-pay | Admitting: Emergency Medicine

## 2021-06-24 ENCOUNTER — Ambulatory Visit (INDEPENDENT_AMBULATORY_CARE_PROVIDER_SITE_OTHER): Payer: Medicare Other

## 2021-06-24 ENCOUNTER — Ambulatory Visit
Admission: EM | Admit: 2021-06-24 | Discharge: 2021-06-24 | Disposition: A | Discharge status: Home / Self Care | Payer: Medicare Other | Attending: Emergency Medicine | Admitting: Emergency Medicine

## 2021-06-24 DIAGNOSIS — R059 Cough, unspecified: Secondary | ICD-10-CM | POA: Diagnosis not present

## 2021-06-24 DIAGNOSIS — J441 Chronic obstructive pulmonary disease with (acute) exacerbation: Secondary | ICD-10-CM

## 2021-06-24 MED ORDER — PREDNISONE 20 MG PO TABS: 40.0000 mg | ORAL_TABLET | Freq: Every day | ORAL | 0 refills | Status: AC

## 2021-06-24 MED ORDER — AEROCHAMBER PLUS MISC: 2 refills | Status: AC

## 2021-06-24 MED ORDER — AMOXICILLIN-POT CLAVULANATE 875-125 MG PO TABS: 1.0000 | ORAL_TABLET | Freq: Two times a day (BID) | ORAL | 0 refills | Status: AC
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
## 2021-06-24 NOTE — ED Triage Notes (Signed)
Productive Cough x 3 weeks with brownish color sputum.   Has tried mucinex without relief

## 2021-06-24 NOTE — Discharge Instructions (Addendum)
2 puffs from your albuterol inhaler every 4 hours for 2 days, then every 6 hours for 2 days, then as needed.  You may back off on the albuterol if you start to feel better sooner.  Finish the Augmentin and prednisone, unless a provider tells you to stop.  The prednisone will elevate your blood sugars.  Get a pulse oximeter to monitor your oxygen levels.  Go to the ED if he gets consistently below 90%

## 2021-12-25 ENCOUNTER — Emergency Department (HOSPITAL_COMMUNITY): Payer: No Typology Code available for payment source

## 2021-12-25 ENCOUNTER — Other Ambulatory Visit: Payer: Self-pay

## 2021-12-25 ENCOUNTER — Ambulatory Visit
Admission: EM | Admit: 2021-12-25 | Discharge: 2021-12-25 | Disposition: A | Discharge status: Home / Self Care | Payer: No Typology Code available for payment source | Attending: Urgent Care | Admitting: Urgent Care

## 2021-12-25 ENCOUNTER — Emergency Department (HOSPITAL_COMMUNITY)
Admission: EM | Admit: 2021-12-25 | Discharge: 2021-12-25 | Disposition: A | Discharge status: Home / Self Care | Payer: No Typology Code available for payment source | Attending: Emergency Medicine | Admitting: Emergency Medicine

## 2021-12-25 ENCOUNTER — Encounter (HOSPITAL_COMMUNITY): Payer: Self-pay

## 2021-12-25 ENCOUNTER — Ambulatory Visit (INDEPENDENT_AMBULATORY_CARE_PROVIDER_SITE_OTHER): Payer: Medicare Other

## 2021-12-25 DIAGNOSIS — I519 Heart disease, unspecified: Secondary | ICD-10-CM | POA: Diagnosis not present

## 2021-12-25 DIAGNOSIS — R531 Weakness: Secondary | ICD-10-CM

## 2021-12-25 DIAGNOSIS — Z950 Presence of cardiac pacemaker: Secondary | ICD-10-CM | POA: Insufficient documentation

## 2021-12-25 DIAGNOSIS — R0902 Hypoxemia: Secondary | ICD-10-CM

## 2021-12-25 DIAGNOSIS — Z86718 Personal history of other venous thrombosis and embolism: Secondary | ICD-10-CM

## 2021-12-25 DIAGNOSIS — R0602 Shortness of breath: Secondary | ICD-10-CM

## 2021-12-25 DIAGNOSIS — Z7901 Long term (current) use of anticoagulants: Secondary | ICD-10-CM | POA: Insufficient documentation

## 2021-12-25 DIAGNOSIS — R600 Localized edema: Secondary | ICD-10-CM | POA: Diagnosis not present

## 2021-12-25 DIAGNOSIS — R053 Chronic cough: Secondary | ICD-10-CM | POA: Diagnosis not present

## 2021-12-25 DIAGNOSIS — D696 Thrombocytopenia, unspecified: Secondary | ICD-10-CM | POA: Diagnosis not present

## 2021-12-25 DIAGNOSIS — N289 Disorder of kidney and ureter, unspecified: Secondary | ICD-10-CM | POA: Diagnosis not present

## 2021-12-25 DIAGNOSIS — I10 Essential (primary) hypertension: Secondary | ICD-10-CM | POA: Diagnosis not present

## 2021-12-25 DIAGNOSIS — R2681 Unsteadiness on feet: Secondary | ICD-10-CM

## 2021-12-25 DIAGNOSIS — Z79899 Other long term (current) drug therapy: Secondary | ICD-10-CM | POA: Insufficient documentation

## 2021-12-25 DIAGNOSIS — Z7982 Long term (current) use of aspirin: Secondary | ICD-10-CM | POA: Diagnosis not present

## 2021-12-25 LAB — URINALYSIS, ROUTINE W REFLEX MICROSCOPIC
Bacteria, UA: NONE SEEN
Bilirubin Urine: NEGATIVE
Glucose, UA: NEGATIVE mg/dL
Ketones, ur: NEGATIVE mg/dL
Leukocytes,Ua: NEGATIVE
Nitrite: NEGATIVE
Protein, ur: 30 mg/dL — AB
Specific Gravity, Urine: 1.011 (ref 1.005–1.030)
pH: 6 (ref 5.0–8.0)

## 2021-12-25 LAB — POCT URINALYSIS DIP (MANUAL ENTRY)
Bilirubin, UA: NEGATIVE
Glucose, UA: NEGATIVE mg/dL
Ketones, POC UA: NEGATIVE mg/dL
Leukocytes, UA: NEGATIVE
Nitrite, UA: NEGATIVE
Protein Ur, POC: 100 mg/dL — AB
Spec Grav, UA: 1.01 (ref 1.010–1.025)
Urobilinogen, UA: 1 E.U./dL
pH, UA: 6 (ref 5.0–8.0)

## 2021-12-25 LAB — CBC
HCT: 36.3 % — ABNORMAL LOW (ref 39.0–52.0)
Hemoglobin: 12.8 g/dL — ABNORMAL LOW (ref 13.0–17.0)
MCH: 32.6 pg (ref 26.0–34.0)
MCHC: 35.3 g/dL (ref 30.0–36.0)
MCV: 92.4 fL (ref 80.0–100.0)
Platelets: 92 10*3/uL — ABNORMAL LOW (ref 150–400)
RBC: 3.93 MIL/uL — ABNORMAL LOW (ref 4.22–5.81)
RDW: 14.1 % (ref 11.5–15.5)
WBC: 6.4 10*3/uL (ref 4.0–10.5)
nRBC: 0 % (ref 0.0–0.2)

## 2021-12-25 LAB — COMPREHENSIVE METABOLIC PANEL
ALT: 18 U/L (ref 0–44)
AST: 23 U/L (ref 15–41)
Albumin: 3.8 g/dL (ref 3.5–5.0)
Alkaline Phosphatase: 53 U/L (ref 38–126)
Anion gap: 10 (ref 5–15)
BUN: 32 mg/dL — ABNORMAL HIGH (ref 8–23)
CO2: 25 mmol/L (ref 22–32)
Calcium: 8.5 mg/dL — ABNORMAL LOW (ref 8.9–10.3)
Chloride: 94 mmol/L — ABNORMAL LOW (ref 98–111)
Creatinine, Ser: 2.26 mg/dL — ABNORMAL HIGH (ref 0.61–1.24)
GFR, Estimated: 27 mL/min — ABNORMAL LOW (ref >60)
Glucose, Bld: 182 mg/dL — ABNORMAL HIGH (ref 70–99)
Potassium: 3.8 mmol/L (ref 3.5–5.1)
Sodium: 129 mmol/L — ABNORMAL LOW (ref 135–145)
Total Bilirubin: 3 mg/dL — ABNORMAL HIGH (ref 0.3–1.2)
Total Protein: 6.8 g/dL (ref 6.5–8.1)

## 2021-12-25 LAB — POCT FASTING CBG KUC MANUAL ENTRY: POCT Glucose (KUC): 236 mg/dL — AB (ref 70–99)

## 2021-12-25 MED ORDER — DOXYCYCLINE HYCLATE 100 MG PO CAPS: 100.0000 mg | ORAL_CAPSULE | Freq: Two times a day (BID) | ORAL | 0 refills | Status: DC

## 2021-12-25 MED ORDER — SODIUM CHLORIDE 0.9 % IV BOLUS
1000.0000 mL | Freq: Once | INTRAVENOUS | Status: AC
  Administered 2021-12-25: 1000 mL via INTRAVENOUS

## 2021-12-25 NOTE — ED Provider Notes (Signed)
[facility] EMERGENCY DEPARTMENT Provider Note   CSN: [id number] Arrival date & time: 12/25/21  1055     History  Chief Complaint  Patient presents with   Shortness of Breath   Weakness    Dustin Vincent is a 88 y.o. male.   Shortness of Breath Associated symptoms: cough   Associated symptoms: no abdominal pain, no chest pain, no fever, no headaches, no neck pain, no rash, no sore throat and no vomiting   Weakness Associated symptoms: cough and frequency   Associated symptoms: no abdominal pain, no chest pain, no diarrhea, no dysuria, no fever, no headaches, no nausea, no shortness of breath and no vomiting    88 y/o male - hx of chronic cough - pacemaker, Htn and high cholesterol - with a couple of days of worsening weakness -s tates that he had no energy this morning - trouble walking b/c of weakness - no SOB more than usual - chronic cough - has had some difficulty urinating - up 3 times last night without much urine passage - no dysuria, no diarrhea, no blood in stool, no CP/Abd pain or HA.  He has no blurred vision, sore throat, rash or other c/o.  The patient was initially seen at the urgent care but was referred to the emergency department because of an oxygen that seem to be low and is generalized weakness which would prompt the need for increasing work-up.  Again the patient denies chest pain or shortness of breath may    Home Medications Prior to Admission medications   Medication Sig Start Date End Date Taking? Authorizing Provider  albuterol (PROVENTIL HFA;VENTOLIN HFA) 108 (90 Base) MCG/ACT inhaler Inhale 2 puffs into the lungs every 6 (six) hours as needed for wheezing or shortness of breath. 08/22/15  Yes Memon, Jolaine Artist, MD  apixaban (ELIQUIS) 5 MG TABS tablet TAKE ONE-HALF TABLET BY MOUTH EVERY 12 HOURS CAUTION BLOOD THINNER **NOTE CHANGE IN TABLET STRENGTH AND DIRECTIONS** 02/25/21  Yes [provider]  atorvastatin (LIPITOR) 80 MG tablet Take 40 mg by  mouth at bedtime.   Yes [provider]  Cholecalciferol 25 MCG (1000 UT) tablet Take 1 tablet by mouth daily. 01/11/21  Yes [provider]  cyanocobalamin 1000 MCG tablet Take 1 tablet by mouth daily. 01/11/21  Yes [provider]  dextromethorphan-guaiFENesin (MUCINEX DM) 30-600 MG 12hr tablet Take 1 tablet by mouth daily.   Yes [provider]  furosemide (LASIX) 40 MG tablet Take 80 mg by mouth daily.   Yes [provider]  isosorbide mononitrate (IMDUR) 30 MG 24 hr tablet TAKE ONE TABLET BY MOUTH ONCE EVERY DAY FOR HEART (DO NOT TAKE CIALIS, VIAGRA OR LEVITRA WHILE YOU ARE ON THIS MEDICATION). 05/14/21  Yes [provider]  levothyroxine (SYNTHROID) 25 MCG tablet TAKE THREE TABLETS BY MOUTH EVERY DAY FOR THYROID 03/02/21  Yes [provider]  losartan (COZAAR) 100 MG tablet Take 100 mg by mouth daily.   Yes [provider]  losartan (COZAAR) 50 MG tablet Take 1 tablet by mouth daily. 07/09/20  Yes [provider]  metoprolol succinate (TOPROL-XL) 100 MG 24 hr tablet Take 50 mg by mouth daily. Take with or immediately following a meal.   Yes [provider]  nitroGLYCERIN (NITROSTAT) 0.4 MG SL tablet DISSOLVE ONE TABLET UNDER THE TONGUE EVERY 5 MINUTES AS NEEDED FOR CHEST PAIN. REPEAT EVERY 5 MINUTES UP TO 3 DOSES. IF NO RELIEF SEEK MEDICAL HELP.(DO NOT TAKE CIALIS, VIAGRA OR  LEVITRA WHILE YOU ARE ON THIS MEDICATION ). 07/09/20  Yes [provider]  omeprazole (PRILOSEC) 20 MG capsule TAKE ONE CAPSULE BY MOUTH TWO TIMES A DAY TO CONTROL STOMACH ACID.   TAKE 15-30 MINUTES BEFORE A MEAL 03/30/21  Yes [provider]  polyethylene glycol powder (GLYCOLAX/MIRALAX) 17 GM/SCOOP powder MIX 17GM (1 CAPFUL) BY MOUTH TWO TIMES A DAY MIX 17 GRAMS (USE BOTTLE CAP) IN LIQUID OF CHOICE AS DIRECTED 04/05/21  Yes [provider]  tamsulosin (FLOMAX) 0.4 MG CAPS capsule Take 0.4 mg by mouth daily after  supper.   Yes [provider]  Tiotropium Bromide-Olodaterol 2.5-2.5 MCG/ACT AERS INHALE 2 INHALATIONS BY ORAL INHALATION EVERY DAY 04/05/21  Yes [provider]  torsemide (DEMADEX) 20 MG tablet TAKE TWO TABLETS BY MOUTH IN THE MORNING AND TAKE ONE TABLET IN THE EVENING -NOTE DOSE 04/05/21  Yes [provider]  aspirin EC 81 MG tablet Take 81 mg by mouth daily. Patient not taking: Reported on 12/25/2021    [provider]  ferrous sulfate 325 (65 FE) MG tablet Take 325 mg by mouth daily with breakfast. Patient not taking: Reported on 12/25/2021    [provider]  fluticasone (FLONASE) 50 MCG/ACT nasal spray Place 1 spray into both nostrils daily. Patient not taking: Reported on 12/25/2021    [provider]  gabapentin (NEURONTIN) 300 MG capsule Take 300 mg by mouth 3 (three) times daily. Take one capsule by mouth at bedtime for 2 days, then take one capsule 2 times a day for 2 days, then take 1 capsule 3 times a day for 14 days, then take 2 capsules 3 times per day for chronic cough. Started on 02.02.17. Patient not taking: Reported on 12/25/2021    [provider]  guaiFENesin (MUCINEX) 600 MG 12 hr tablet Take 1 tablet (600 mg total) by mouth 2 (two) times daily. Patient not taking: Reported on 12/25/2021 08/22/15   Kathie Dike, MD  Multiple Vitamin (MULTIVITAMIN WITH MINERALS) TABS tablet Take 1 tablet by mouth daily. Patient not taking: Reported on 12/25/2021    [provider]  oxyCODONE (OXY IR/ROXICODONE) 5 MG immediate release tablet Take 5 mg by mouth 2 (two) times daily as needed for severe pain. Patient not taking: Reported on 12/25/2021    [provider]  promethazine (PHENERGAN) 25 MG tablet Take 12.5 mg by mouth every 6 (six) hours as needed for nausea or vomiting. Patient not taking: Reported on 12/25/2021    [provider]  saxagliptin HCl (ONGLYZA) 5 MG TABS tablet Take 5 mg by mouth  daily. Patient not taking: Reported on 12/25/2021    [provider]  sennosides-docusate sodium (SENOKOT-S) 8.6-50 MG tablet Take 1 tablet by mouth daily.    [provider]  Spacer/Aero-Holding Chambers (AEROCHAMBER PLUS) inhaler Use with inhaler 06/24/21   Melynda Ripple, MD  spironolactone (ALDACTONE) 25 MG tablet Take 12.5 mg by mouth daily.     [provider]      Allergies    Gabapentin and Sulfa antibiotics    Review of Systems   Review of Systems  Constitutional:  Negative for chills and fever.  HENT:  Negative for sore throat.   Eyes:  Negative for visual disturbance.  Respiratory:  Positive for cough. Negative for shortness of breath.   Cardiovascular:  Negative for chest pain.  Gastrointestinal:  Negative for abdominal pain, diarrhea, nausea and vomiting.  Genitourinary:  Positive for frequency. Negative for dysuria.  Musculoskeletal:  Negative for back pain and neck pain.  Skin:  Negative for rash.  Neurological:  Positive for weakness. Negative for numbness and headaches.  Hematological:  Negative for adenopathy.  Psychiatric/Behavioral:  Negative for behavioral problems.   All other systems reviewed and are negative.   Physical Exam Updated Vital Signs BP 137/90   Pulse (!) 118   Temp 99.7 F (37.6 C) (Rectal)   Resp (!) 21   Ht 1.803 m ('5\' 11"'$ )   Wt 74.8 kg   SpO2 97%   BMI 23.01 kg/m  Physical Exam Vitals and nursing note reviewed.  Constitutional:      General: He is not in acute distress.    Appearance: He is well-developed.  HENT:     Head: Normocephalic and atraumatic.     Mouth/Throat:     Mouth: Mucous membranes are dry.     Pharynx: No oropharyngeal exudate.  Eyes:     General: No scleral icterus.       Right eye: No discharge.        Left eye: No discharge.     Conjunctiva/sclera: Conjunctivae normal.     Pupils: Pupils are equal, round, and reactive to light.  Neck:     Thyroid: No thyromegaly.      Vascular: No JVD.  Cardiovascular:     Rate and Rhythm: Normal rate and regular rhythm.     Heart sounds: Normal heart sounds. No murmur heard.    No friction rub. No gallop.  Pulmonary:     Effort: Pulmonary effort is normal. No respiratory distress.     Breath sounds: Wheezing present. No rales.     Comments: Occasional cough on exam, occasional wheeze, speaks in full sentences without increased respiratory work of breathing or distress Abdominal:     General: Bowel sounds are normal. There is no distension.     Palpations: Abdomen is soft. There is no mass.     Tenderness: There is no abdominal tenderness.  Musculoskeletal:        General: No tenderness. Normal range of motion.     Cervical back: Normal range of motion and neck supple.     Right lower leg: Edema present.     Left lower leg: Edema present.     Comments: 1+ pitting edema bilaterally, symmetrical below the knees  Lymphadenopathy:     Cervical: No cervical adenopathy.  Skin:    General: Skin is warm and dry.     Findings: No erythema or rash.  Neurological:     General: No focal deficit present.     Mental Status: He is alert.     Coordination: Coordination normal.     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  All 4 extremities with normal strength sensation and coordination.  Speech is clear  Psychiatric:        Behavior: Behavior normal.     ED Results / Procedures / Treatments   Labs (all labs ordered are listed, but only abnormal results are displayed) Labs Reviewed  CBC - Abnormal; Notable for the following components:      Result Value   RBC 3.93 (*)    Hemoglobin 12.8 (*)    HCT 36.3 (*)    Platelets 92 (*)    All other components within normal limits  URINALYSIS, ROUTINE W REFLEX MICROSCOPIC -  Abnormal; Notable for the following components:   Hgb urine  dipstick SMALL (*)    Protein, ur 30 (*)    All other components within normal limits  COMPREHENSIVE METABOLIC PANEL - Abnormal; Notable for the following components:   Sodium 129 (*)    Chloride 94 (*)    Glucose, Bld 182 (*)    BUN 32 (*)    Creatinine, Ser 2.26 (*)    Calcium 8.5 (*)    Total Bilirubin 3.0 (*)    GFR, Estimated 27 (*)    All other components within normal limits  URINE CULTURE  CBG MONITORING, ED    EKG EKG Interpretation  Date/Time:  Sunday December 25 2021 11:34:23 EDT Ventricular Rate:  74 PR Interval:    QRS Duration: 182 QT Interval:  486 QTC Calculation: 540 R Axis:   253 Text Interpretation: Afib/flutter and ventricular-paced rhythm No further analysis attempted due to paced rhythm Confirmed by Noemi Chapel [phone]) on 12/25/2021 12:05:57 PM  Radiology DG Chest 2 View  Result Date: 12/25/2021 CLINICAL DATA:  Shortness of breath. EXAM: CHEST - 2 VIEW COMPARISON:  June 24, 2021 FINDINGS: Stable pacemaker/AICD device. The heart, hila, mediastinum are unremarkable. No pneumothorax. The left lung is clear. Mild haziness over the right perihilar region is similar in the interval and may be due to patient rotation. No other abnormalities identified. IMPRESSION: Mild haziness over the right perihilar region may be due to patient rotation, similar in the interval. Recommend attention to this region on follow-up. No other abnormalities identified. Electronically Signed   By: Dorise Bullion III M.D.   On: 12/25/2021 09:36    Procedures Procedures    Medications Ordered in ED Medications  sodium chloride 0.9 % bolus 1,000 mL (0 mLs Intravenous Stopped 12/25/21 1350)    ED Course/ Medical Decision Making/ A&P                           Medical Decision Making Amount and/or Complexity of Data Reviewed Labs: ordered. Radiology: ordered.   X-rays have already been performed today showing a mild haziness over the right perihilar region but no other acute  findings.  The patient's heart rate is around 70 his oxygen is 96 to 98% on room air he is afebrile with a normal blood pressure.  The cause of weakness is not exactly clear but will obtain labs and a urinalysis with a culture.  He will be given some IV fluids.  The patient is agreeable, his appetite has been decreased  Labs: I personally viewed and interpreted labs which do show a slight renal insufficiency, compared to his baseline.  His creatinine was 2.26 with a baseline of 1.83, his BUN is 32 and it was 18 a couple of years ago.  His electrolytes are chronically hyponatremic.  CBC showed hemoglobin of 12.8 and a white blood cell count of 6400.  He is chronically thrombocytopenic with a platelet of 92,000.  Urinalysis showed no ketones, specific gravity was okay, small amount of protein.  X-ray from prehospital at urgent care showed that the patient had a possible haziness over the right perihilar region.  Due to no definite evidence as to why the patient is generally weak CT scans will be ordered to make sure there is no signs of stroke, pneumonia or intra-abdominal findings.  The patient is agreeable to the plan.  Thankfully his temperature was 99.6 per rectum, he is not tachycardic his heart rate  is in the 80s for me on my exam at the time of change of shift.  At the time of change of shift care will be signed out to Dr. Verneda Skill to follow-up results and disposition accordingly        Final Clinical Impression(s) / ED Diagnoses Final diagnoses:  Renal insufficiency  Generalized weakness    Rx / DC Orders ED Discharge Orders     None         Noemi Chapel, MD 12/25/21 [phone]

## 2021-12-25 NOTE — Discharge Instructions (Signed)
Follow-up with Dr. Nevada Crane or your doctor at the [facility].  I am going to give you a copy of the test we have done so your doctors can see what is been done

## 2021-12-25 NOTE — ED Triage Notes (Signed)
Pt states that he has some sob. Pt states that he has an unsteady gait as well.  Pt states that he does have inhaler at home but hasn't used it today.

## 2021-12-25 NOTE — Discharge Instructions (Signed)
Please report to [facility] now as I am concerned that you need a higher level of care than we can provide in the urgent care setting. I am concerned that you will need a CT scan, lab work to rule out pulmonary embolism, community acquired pneumonia, etc. Do not go home, go straight to the hospital.
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
## 2021-12-25 NOTE — ED Triage Notes (Signed)
[facility] and generalized weakness since Thursday. Lower extremity edema noted. [id number], also reports difficulty with urination.

## 2021-12-25 NOTE — ED Provider Notes (Signed)
Patient CT scan shows some increased left nodes in the chest.  May be related to infection.  Patient is placed on doxycycline and will follow-up with either his family doctor here or at the [place].   Milton Ferguson, MD 12/25/21 [phone]

## 2021-12-26 LAB — URINE CULTURE: Culture: NO GROWTH

## 2023-01-06 ENCOUNTER — Ambulatory Visit
Admission: EM | Admit: 2023-01-06 | Discharge: 2023-01-06 | Disposition: A | Discharge status: Home / Self Care | Payer: No Typology Code available for payment source | Attending: Family Medicine | Admitting: Family Medicine

## 2023-01-06 DIAGNOSIS — U071 COVID-19: Secondary | ICD-10-CM | POA: Diagnosis not present

## 2023-01-06 DIAGNOSIS — J069 Acute upper respiratory infection, unspecified: Secondary | ICD-10-CM | POA: Diagnosis present

## 2023-01-06 MED ORDER — BENZONATATE 100 MG PO CAPS: 100.0000 mg | ORAL_CAPSULE | Freq: Three times a day (TID) | ORAL | 0 refills | Status: DC

## 2023-01-06 MED ORDER — FLUTICASONE PROPIONATE 50 MCG/ACT NA SUSP: 1.0000 | Freq: Two times a day (BID) | NASAL | 2 refills | Status: AC

## 2023-01-06 NOTE — Discharge Instructions (Signed)
We have tested you for COVID today, this result should be available tomorrow and someone will reach out if it was positive to discuss antiviral options.  In the meantime, I have sent over a steroid nasal spray and cough Perles and you may take medications such as Coricidin HBP and plain Mucinex additionally.  Return for worsening symptoms.
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
## 2023-01-06 NOTE — ED Triage Notes (Signed)
Pt reports his nose is runny and has a bad cough x 1 day

## 2023-01-07 LAB — SARS CORONAVIRUS 2 (TAT 6-24 HRS): SARS Coronavirus 2: POSITIVE — AB

## 2023-05-07 ENCOUNTER — Ambulatory Visit
Admission: EM | Admit: 2023-05-07 | Discharge: 2023-05-07 | Disposition: A | Discharge status: Home / Self Care | Payer: No Typology Code available for payment source | Attending: Family Medicine | Admitting: Family Medicine

## 2023-05-07 DIAGNOSIS — R040 Epistaxis: Secondary | ICD-10-CM | POA: Diagnosis not present

## 2023-05-07 DIAGNOSIS — Z7901 Long term (current) use of anticoagulants: Secondary | ICD-10-CM | POA: Diagnosis not present

## 2023-05-07 MED ORDER — OXYMETAZOLINE HCL 0.05 % NA SOLN: NASAL | 0 refills | Status: AC
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
## 2023-05-07 NOTE — Discharge Instructions (Signed)
Run a humidifier in the home, especially overnight.  Gently apply Vaseline or Aquaphor to the left nostril with a Q-tip to help protect the sensitive tissue.  When you do have a nosebleed, you may try the Afrin nasal spray that I have prescribed as well as direct pressure.  This should help stop the bleeds quickly.  If you have an uncontrollable nosebleed go to the emergency department as you are on a blood thinner.

## 2023-05-07 NOTE — ED Triage Notes (Signed)
Pt reports nose bleed while on blood thinners. Pt states he has had them 4 times this month. They come in spells always on the left side.

## 2023-06-24 ENCOUNTER — Ambulatory Visit (INDEPENDENT_AMBULATORY_CARE_PROVIDER_SITE_OTHER): Payer: Medicare Other

## 2023-06-24 ENCOUNTER — Ambulatory Visit
Admission: EM | Admit: 2023-06-24 | Discharge: 2023-06-24 | Disposition: A | Discharge status: Home / Self Care | Payer: No Typology Code available for payment source | Attending: Physician Assistant | Admitting: Physician Assistant

## 2023-06-24 DIAGNOSIS — J441 Chronic obstructive pulmonary disease with (acute) exacerbation: Secondary | ICD-10-CM | POA: Diagnosis not present

## 2023-06-24 DIAGNOSIS — R051 Acute cough: Secondary | ICD-10-CM | POA: Diagnosis not present

## 2023-06-24 DIAGNOSIS — R9389 Abnormal findings on diagnostic imaging of other specified body structures: Secondary | ICD-10-CM

## 2023-06-24 LAB — POC COVID19/FLU A&B COMBO
Covid Antigen, POC: NEGATIVE
Influenza A Antigen, POC: NEGATIVE
Influenza B Antigen, POC: NEGATIVE

## 2023-06-24 MED ORDER — AEROCHAMBER PLUS FLO-VU LARGE MISC
1.0000 | Freq: Once | Status: AC
Start: 2023-06-24 — End: 2023-06-24
  Administered 2023-06-24: 1

## 2023-06-24 MED ORDER — PREDNISONE 20 MG PO TABS: 20.0000 mg | ORAL_TABLET | Freq: Every day | ORAL | 0 refills | Status: AC

## 2023-06-24 MED ORDER — DOXYCYCLINE HYCLATE 100 MG PO CAPS: 100.0000 mg | ORAL_CAPSULE | Freq: Two times a day (BID) | ORAL | 0 refills | Status: DC

## 2023-06-24 MED ORDER — ALBUTEROL SULFATE HFA 108 (90 BASE) MCG/ACT IN AERS
2.0000 | INHALATION_SPRAY | Freq: Once | RESPIRATORY_TRACT | Status: AC
  Administered 2023-06-24: 2 via RESPIRATORY_TRACT

## 2023-06-24 MED ORDER — BENZONATATE 100 MG PO CAPS: 100.0000 mg | ORAL_CAPSULE | Freq: Three times a day (TID) | ORAL | 0 refills | Status: DC

## 2023-06-24 NOTE — ED Triage Notes (Addendum)
Pt reports cough, congestion, hoarseness, sinus drainage , headache x 1 denies fever.
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
## 2023-06-24 NOTE — Discharge Instructions (Signed)
You were negative for flu and COVID.  I am concerned that you have a COPD exacerbation.  Use the albuterol every 4-6 hours as needed.  Start prednisone 20 mg for 5 days.  Do not take NSAIDs with this medication including aspirin, ibuprofen/Advil, naproxen/Aleve.  Use benzonatate for cough.  Take doxycycline to cover for infection.  Stay out of the sun while on this medicine.  Your x-ray was slightly abnormal which is likely just because of how your nipple appeared on the film, however, I would like you to follow-up with your primary care to have this repeated in a few weeks.  Call them to schedule an appointment.  If anything worsens or changes you should go to the emergency room including high fever, worsening cough, shortness of breath.

## 2023-09-02 ENCOUNTER — Ambulatory Visit
Admission: EM | Admit: 2023-09-02 | Discharge: 2023-09-02 | Disposition: A | Discharge status: Home / Self Care | Attending: Family Medicine | Admitting: Family Medicine

## 2023-09-02 ENCOUNTER — Other Ambulatory Visit: Payer: Self-pay

## 2023-09-02 ENCOUNTER — Ambulatory Visit (INDEPENDENT_AMBULATORY_CARE_PROVIDER_SITE_OTHER)

## 2023-09-02 ENCOUNTER — Encounter: Payer: Self-pay | Admitting: Emergency Medicine

## 2023-09-02 DIAGNOSIS — J441 Chronic obstructive pulmonary disease with (acute) exacerbation: Secondary | ICD-10-CM

## 2023-09-02 DIAGNOSIS — R051 Acute cough: Secondary | ICD-10-CM

## 2023-09-02 MED ORDER — GUAIFENESIN ER 600 MG PO TB12: 600.0000 mg | ORAL_TABLET | Freq: Two times a day (BID) | ORAL | 0 refills | Status: DC | PRN

## 2023-09-02 MED ORDER — AZITHROMYCIN 250 MG PO TABS: ORAL_TABLET | ORAL | 0 refills | Status: DC

## 2023-09-02 NOTE — Discharge Instructions (Signed)
 Your x-ray today came back negative for pneumonia.  I have sent over an antibiotic and Mucinex to take in addition to [employer] and inhaler regimen that you already have at home.  Make sure to be taking deep full breaths and you may use medications such as Coricidin HBP, Flonase, Tylenol additionally.  Follow-up for worsening symptoms.
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
## 2023-09-02 NOTE — ED Triage Notes (Signed)
 Pt reports productive cough since Friday.

## 2024-03-03 ENCOUNTER — Ambulatory Visit
Admission: EM | Admit: 2024-03-03 | Discharge: 2024-03-03 | Disposition: A | Discharge status: Home / Self Care | Attending: Nurse Practitioner | Admitting: Nurse Practitioner

## 2024-03-03 DIAGNOSIS — R21 Rash and other nonspecific skin eruption: Secondary | ICD-10-CM | POA: Diagnosis not present

## 2024-03-03 MED ORDER — TRIAMCINOLONE ACETONIDE 0.1 % EX CREA: 1.0000 | TOPICAL_CREAM | Freq: Two times a day (BID) | CUTANEOUS | 0 refills | Status: DC

## 2024-03-03 NOTE — ED Triage Notes (Signed)
 Pt present with c/o a rash in between his thighs x yesterday. Pt states it itches and feels sore. Pt states he put ointment on it and had some relief.
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
## 2024-03-03 NOTE — Discharge Instructions (Signed)
 Apply medication as prescribed. May also take over-the-counter Zyrtec, Claritin  or the daytime to help with itching.  You may take Benadryl  at bedtime as needed for itching. Avoid hot baths or showers while symptoms persist.  Recommend taking lukewarm baths. May apply cool cloths to the area to help with itching or discomfort. Avoid scratching, rubbing, or manipulating the areas while symptoms persist. Recommend Aveeno colloidal oatmeal bath to use to help with drying and itching. If symptoms fail to improve with this treatment, you may follow-up in this clinic or with your primary care physician for further evaluation. Follow-up as needed.

## 2024-03-18 ENCOUNTER — Other Ambulatory Visit: Payer: Self-pay

## 2024-03-18 ENCOUNTER — Observation Stay (HOSPITAL_COMMUNITY)
Admission: EM | Admit: 2024-03-18 | Discharge: 2024-03-19 | Disposition: A | Discharge status: Home / Self Care | Attending: Internal Medicine | Admitting: Internal Medicine

## 2024-03-18 ENCOUNTER — Emergency Department (HOSPITAL_COMMUNITY)

## 2024-03-18 ENCOUNTER — Encounter (HOSPITAL_COMMUNITY): Payer: Self-pay | Admitting: Radiology

## 2024-03-18 DIAGNOSIS — R55 Syncope and collapse: Secondary | ICD-10-CM

## 2024-03-18 DIAGNOSIS — G4089 Other seizures: Secondary | ICD-10-CM

## 2024-03-18 DIAGNOSIS — I5022 Chronic systolic (congestive) heart failure: Secondary | ICD-10-CM

## 2024-03-18 DIAGNOSIS — E1122 Type 2 diabetes mellitus with diabetic chronic kidney disease: Secondary | ICD-10-CM | POA: Insufficient documentation

## 2024-03-18 DIAGNOSIS — Z7982 Long term (current) use of aspirin: Secondary | ICD-10-CM | POA: Diagnosis not present

## 2024-03-18 DIAGNOSIS — D696 Thrombocytopenia, unspecified: Secondary | ICD-10-CM

## 2024-03-18 DIAGNOSIS — I4819 Other persistent atrial fibrillation: Secondary | ICD-10-CM | POA: Diagnosis not present

## 2024-03-18 DIAGNOSIS — R569 Unspecified convulsions: Principal | ICD-10-CM

## 2024-03-18 DIAGNOSIS — E039 Hypothyroidism, unspecified: Secondary | ICD-10-CM | POA: Insufficient documentation

## 2024-03-18 DIAGNOSIS — I441 Atrioventricular block, second degree: Secondary | ICD-10-CM | POA: Diagnosis not present

## 2024-03-18 DIAGNOSIS — Z79899 Other long term (current) drug therapy: Secondary | ICD-10-CM | POA: Insufficient documentation

## 2024-03-18 DIAGNOSIS — I130 Hypertensive heart and chronic kidney disease with heart failure and stage 1 through stage 4 chronic kidney disease, or unspecified chronic kidney disease: Secondary | ICD-10-CM | POA: Diagnosis not present

## 2024-03-18 DIAGNOSIS — E782 Mixed hyperlipidemia: Secondary | ICD-10-CM | POA: Diagnosis not present

## 2024-03-18 DIAGNOSIS — G9340 Encephalopathy, unspecified: Principal | ICD-10-CM

## 2024-03-18 DIAGNOSIS — N184 Chronic kidney disease, stage 4 (severe): Secondary | ICD-10-CM | POA: Diagnosis not present

## 2024-03-18 DIAGNOSIS — G458 Other transient cerebral ischemic attacks and related syndromes: Secondary | ICD-10-CM | POA: Diagnosis not present

## 2024-03-18 DIAGNOSIS — R531 Weakness: Secondary | ICD-10-CM | POA: Diagnosis present

## 2024-03-18 DIAGNOSIS — I2510 Atherosclerotic heart disease of native coronary artery without angina pectoris: Secondary | ICD-10-CM | POA: Insufficient documentation

## 2024-03-18 LAB — URINE DRUG SCREEN
Amphetamines: NEGATIVE
Barbiturates: NEGATIVE
Benzodiazepines: NEGATIVE
Cocaine: NEGATIVE
Fentanyl: NEGATIVE
Methadone Scn, Ur: NEGATIVE
Opiates: NEGATIVE
Tetrahydrocannabinol: NEGATIVE

## 2024-03-18 LAB — HEMOGLOBIN A1C
Hgb A1c MFr Bld: 7.1 % — ABNORMAL HIGH (ref 4.8–5.6)
Mean Plasma Glucose: 157.07 mg/dL

## 2024-03-18 LAB — COMPREHENSIVE METABOLIC PANEL WITH GFR
ALT: 13 U/L (ref 0–44)
AST: 17 U/L (ref 15–41)
Albumin: 4.3 g/dL (ref 3.5–5.0)
Alkaline Phosphatase: 71 U/L (ref 38–126)
Anion gap: 11 (ref 5–15)
BUN: 26 mg/dL — ABNORMAL HIGH (ref 8–23)
CO2: 28 mmol/L (ref 22–32)
Calcium: 9.1 mg/dL (ref 8.9–10.3)
Chloride: 101 mmol/L (ref 98–111)
Creatinine, Ser: 2.15 mg/dL — ABNORMAL HIGH (ref 0.61–1.24)
GFR, Estimated: 29 mL/min — ABNORMAL LOW (ref >60)
Glucose, Bld: 142 mg/dL — ABNORMAL HIGH (ref 70–99)
Potassium: 3.7 mmol/L (ref 3.5–5.1)
Sodium: 139 mmol/L (ref 135–145)
Total Bilirubin: 1.2 mg/dL (ref 0.0–1.2)
Total Protein: 7 g/dL (ref 6.5–8.1)

## 2024-03-18 LAB — URINALYSIS, COMPLETE (UACMP) WITH MICROSCOPIC
Bilirubin Urine: NEGATIVE
Glucose, UA: >=500 mg/dL — AB
Hgb urine dipstick: NEGATIVE
Ketones, ur: NEGATIVE mg/dL
Nitrite: NEGATIVE
Protein, ur: NEGATIVE mg/dL
Specific Gravity, Urine: 1.009 (ref 1.005–1.030)
WBC, UA: >50 WBC/hpf (ref 0–5)
pH: 7 (ref 5.0–8.0)

## 2024-03-18 LAB — CBG MONITORING, ED: Glucose-Capillary: 189 mg/dL — ABNORMAL HIGH (ref 70–99)

## 2024-03-18 LAB — I-STAT CHEM 8, ED
BUN: 31 mg/dL — ABNORMAL HIGH (ref 8–23)
Calcium, Ion: 1.17 mmol/L (ref 1.15–1.40)
Chloride: 101 mmol/L (ref 98–111)
Creatinine, Ser: 2.3 mg/dL — ABNORMAL HIGH (ref 0.61–1.24)
Glucose, Bld: 145 mg/dL — ABNORMAL HIGH (ref 70–99)
HCT: 36 % — ABNORMAL LOW (ref 39.0–52.0)
Hemoglobin: 12.2 g/dL — ABNORMAL LOW (ref 13.0–17.0)
Potassium: 4.1 mmol/L (ref 3.5–5.1)
Sodium: 140 mmol/L (ref 135–145)
TCO2: 28 mmol/L (ref 22–32)

## 2024-03-18 LAB — CBC
HCT: 34.4 % — ABNORMAL LOW (ref 39.0–52.0)
Hemoglobin: 11 g/dL — ABNORMAL LOW (ref 13.0–17.0)
MCH: 28.2 pg (ref 26.0–34.0)
MCHC: 32 g/dL (ref 30.0–36.0)
MCV: 88.2 fL (ref 80.0–100.0)
Platelets: 133 K/uL — ABNORMAL LOW (ref 150–400)
RBC: 3.9 MIL/uL — ABNORMAL LOW (ref 4.22–5.81)
RDW: 17.5 % — ABNORMAL HIGH (ref 11.5–15.5)
WBC: 5.1 K/uL (ref 4.0–10.5)
nRBC: 0 % (ref 0.0–0.2)

## 2024-03-18 LAB — GLUCOSE, CAPILLARY
Glucose-Capillary: 154 mg/dL — ABNORMAL HIGH (ref 70–99)
Glucose-Capillary: 158 mg/dL — ABNORMAL HIGH (ref 70–99)

## 2024-03-18 LAB — DIFFERENTIAL
Abs Immature Granulocytes: 0.02 K/uL (ref 0.00–0.07)
Basophils Absolute: 0 K/uL (ref 0.0–0.1)
Basophils Relative: 0 %
Eosinophils Absolute: 0.1 K/uL (ref 0.0–0.5)
Eosinophils Relative: 1 %
Immature Granulocytes: 0 %
Lymphocytes Relative: 17 %
Lymphs Abs: 0.9 K/uL (ref 0.7–4.0)
Monocytes Absolute: 0.5 K/uL (ref 0.1–1.0)
Monocytes Relative: 9 %
Neutro Abs: 3.7 K/uL (ref 1.7–7.7)
Neutrophils Relative %: 73 %

## 2024-03-18 LAB — PROTIME-INR
INR: 1.3 — ABNORMAL HIGH (ref 0.8–1.2)
Prothrombin Time: 16.4 s — ABNORMAL HIGH (ref 11.4–15.2)

## 2024-03-18 LAB — ETHANOL: Alcohol, Ethyl (B): <15 mg/dL (ref <15)

## 2024-03-18 LAB — FOLATE: Folate: 7.5 ng/mL (ref >5.9)

## 2024-03-18 LAB — VITAMIN B12: Vitamin B-12: 1336 pg/mL — ABNORMAL HIGH (ref 180–914)

## 2024-03-18 LAB — APTT: aPTT: 30 s (ref 24–36)

## 2024-03-18 LAB — TSH: TSH: 2.56 u[IU]/mL (ref 0.350–4.500)

## 2024-03-18 LAB — T4, FREE: Free T4: 1.04 ng/dL (ref 0.61–1.12)

## 2024-03-18 MED ORDER — METOPROLOL SUCCINATE ER 25 MG PO TB24
12.5000 mg | ORAL_TABLET | Freq: Every day | ORAL | Status: DC
  Administered 2024-03-19: 12.5 mg via ORAL
  Filled 2024-03-18: qty 1

## 2024-03-18 MED ORDER — VITAMIN B-12 1000 MCG PO TABS
1000.0000 ug | ORAL_TABLET | Freq: Every day | ORAL | Status: DC
  Administered 2024-03-19: 1000 ug via ORAL
  Filled 2024-03-18: qty 1

## 2024-03-18 MED ORDER — ATORVASTATIN CALCIUM 40 MG PO TABS
40.0000 mg | ORAL_TABLET | Freq: Every day | ORAL | Status: DC
  Administered 2024-03-18: 40 mg via ORAL
  Filled 2024-03-18: qty 1

## 2024-03-18 MED ORDER — FAMOTIDINE 20 MG PO TABS
20.0000 mg | ORAL_TABLET | Freq: Every day | ORAL | Status: DC
Start: 2024-03-18 — End: 2024-03-19
  Administered 2024-03-18: 20 mg via ORAL
  Filled 2024-03-18: qty 1

## 2024-03-18 MED ORDER — APIXABAN 2.5 MG PO TABS
2.5000 mg | ORAL_TABLET | Freq: Two times a day (BID) | ORAL | Status: DC
  Administered 2024-03-18 – 2024-03-19 (×2): 2.5 mg via ORAL
  Filled 2024-03-18 (×2): qty 1

## 2024-03-18 MED ORDER — VITAMIN D 25 MCG (1000 UNIT) PO TABS
1000.0000 [IU] | ORAL_TABLET | Freq: Every day | ORAL | Status: DC
  Administered 2024-03-19: 1000 [IU] via ORAL
  Filled 2024-03-18: qty 1

## 2024-03-18 MED ORDER — TAMSULOSIN HCL 0.4 MG PO CAPS
0.8000 mg | ORAL_CAPSULE | Freq: Every day | ORAL | Status: DC
  Administered 2024-03-18 – 2024-03-19 (×2): 0.8 mg via ORAL
  Filled 2024-03-18 (×3): qty 2

## 2024-03-18 MED ORDER — LEVOTHYROXINE SODIUM 75 MCG PO TABS
75.0000 ug | ORAL_TABLET | Freq: Every day | ORAL | Status: DC
  Administered 2024-03-19: 75 ug via ORAL
  Filled 2024-03-18: qty 1

## 2024-03-18 NOTE — Progress Notes (Signed)
 Patient was monitored by this RN during MRI scan due to the presence of a pacemaker. Cardiac rhythm was continuously monitored throughout the procedure. Prior to the start of the scan, the pacemaker was placed in MRI-safe mode by the MRI technician using the Medtronic device and ipad. Following completion of the scan, the device was returned to its pre-MRI settings. Neurological status and orientation post-procedure were unchanged from baseline.  Pre-procedure Heart Rate (Prior to being placed in MRI safe mode): 83    Post-procedure Heart Rate (Once pacemaker is returned to baseline mode): 83

## 2024-03-18 NOTE — ED Notes (Signed)
 Tele stroke cart being used in ED for another stroke patient. ED tech had to go to 3rd floor to retrieve ap stroke cart 2. This is the reason in delay of tele neurologist notification being delayed.

## 2024-03-18 NOTE — ED Notes (Signed)
Pt transferred to MRI
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
## 2024-03-18 NOTE — Plan of Care (Signed)

## 2024-03-18 NOTE — ED Notes (Signed)
CODE STROKE activated at this time.

## 2024-03-18 NOTE — ED Triage Notes (Signed)
 Pt arrived via POV with family who report at apprx 0750 Pt developed weakness, Pt turned white in color, and Pts vision went blurry. Per family, Pt was confused as well. Pt is on Eliquis. During Triage, Pt presents in NAD, and is A+O X 4, and family report concern for possible TIA.

## 2024-03-18 NOTE — Hospital Course (Signed)
 89 year old male with a history of ischemic cardiomyopathy with EF 30% (04/2023), coronary disease with history of CABG x 3 (2008), COPD, hypertension, diabetes mellitus type 2, hyperlipidemia, GERD, BPH, Mobitz type II AV block status post ablation and ICD placement, persistent atrial fibrillation presenting with transient episode of altered mentation and lack of awareness. The patient is a difficult historian.  History is supplemented by the patient's family. Per the patient's family, the patient has about 2-3 episodes per week with each episode lasting less than 5 minutes. The episodes are characterized by the patient usually staring off into space, acting confused, and not answering questions. Around 7:50 AM on 03/18/2024, the patient and his family were eating at [employer] sitting down.  The patient developed an episode where his face became pale and he had a blank stare on his face.  The patient appeared confused and not answering questions.  The patient himself states that he can hear the questions being asked but he is having difficulty getting his words and answering the questions. During the episode, the patient did not have any focal deficits, loss of vision, or dysesthesias.  As with prior episodes, the patient eventually decided to get up out of the chair and stand.  Family states that when this happens  his legs do not know where to go.  The patient himself describes that his legs feel heavy and he has to hold onto something to prevent falling. The entire episode on the morning of admission lasted about an hour.  The patient was normal by the time he arrived in the emergency department. The patient states that occasionally these episodes have been accompanied by headache. The patient does not know his medications very well, but he does not think that he has been started any new medications recently.  In the ED, the patient was afebrile and hemodynamically stable with oxygen  saturation 100%  room air.  WBC 5.1, hemoglobin 11.0, platelets 133.  Sodium 139, potassium 3.7, bicarbonate 28, serum creatinine 2.15.  LFTs unremarkable.  Urine drug screen was negative.  CT of the brain was negative for acute findings.  MRI of the brain was negative for acute infarct or hemorrhage.  EKG showed a paced rhythm with right bundle branch block. The patient was seen by teleneuro who recommended admission for further workup.

## 2024-03-18 NOTE — Progress Notes (Addendum)
 [facility] Telemedicine Consult   Requesting Provider: Dr. Suzette Consult Participants: Patient, his son and bedside RN Location of the provider: [facility] Location of the patient: [facility] emergency room  This consult was provided via telemedicine with 2-way video and audio communication. The patient/family was informed that care would be provided in this way and agreed to receive care in this manner.    Chief Complaint: Weakness and blurred vision  HPI: Dustin Vincent is a 89 year old Caucasian male with past medical history of hypertension, hyperlipidemia, gastroesophageal reflux disease, DVT s/p IVC filter, coronary artery disease, diabetes, arthritis and back pain.  Patient was in usual state of health until 7:50 AM when he states he started feeling that he is about to pass out.  He states his son was an eyewitness noticed that his face became pale and had a blank look on his face and he was weak all over.  Patient appeared confused and was not able to answer questions appropriately and this lasted almost an hour and improved after patient reached the emergency room.  There is no slurred speech or focal extremity weakness or numbness, double vision noted.  Patient states that his vision was blurred in both eyes he could see things but they were not clear.  Denied any headache.  Denies any prior history of strokes or TIAs.  Does have previous history of presyncopal symptoms on numerous occasions. Noncontrast CT scan of the head shows extensive changes of age-related small vessel disease and generalized cerebral atrophy.  No acute abnormalities noted.  Aspect score is 10.  Patient is already on Eliquis and took his dose today.   LKW: 0750 tnk given?: No, symptoms almost resolved and patient is on Eliquis IR Thrombectomy? No, clinical presentation not compatible with LVO. Modified Rankin Scale: 3-Moderate disability-requires help but walks WITHOUT assistance Time of  teleneurologist evaluation: 10:25 AM  Exam: Vitals:   03/18/24 1030 03/18/24 1045  BP:  (!) 130/111  Pulse: 69 70  Resp: 16 14  Temp:    SpO2: 100% 100%    General: Frail cachectic looking elderly Caucasian male not in distress. He is awake alert.  Oriented x 2.  Speech is clear without dysarthria or aphasia.  Extraocular movements are full range without nystagmus.  Vision acuity is diminished in the left eye but that is baseline.  Visual fields otherwise appear full.  Face is symmetric without weakness.  Tongue is midline. Motor system exam symmetric upper and lower extremities strength without focal weakness.  Sensation intact bilaterally.  Coordination intact bilaterally.  Gait not tested.  NIHSS 1A: Level of Consciousness - 0 1B: Ask Month and Age - 1 1C: 'Blink Eyes' & 'Squeeze Hands' - 0 2: Test Horizontal Extraocular Movements - 0 3: Test Visual Fields - 0 4: Test Facial Palsy - 0 5A: Test Left Arm Motor Drift - 0 5B: Test Right Arm Motor Drift - 0 6A: Test Left Leg Motor Drift - 0 6B: Test Right Leg Motor Drift - 0 7: Test Limb Ataxia - 0 8: Test Sensation - 0 9: Test Language/Aphasia- 0 10: Test Dysarthria - 0 11: Test Extinction/Inattention - 0 NIHSS score: 1   Imaging Reviewed: CT head noncontrast study shows changes of small vessel disease and generalized atrophy.  No acute abnormality.  Labs reviewed in epic and pertinent values follow: Pending   Assessment: 89 year old Caucasian male with transient episode of near passing out with blurred vision, generalized weakness and confusion disorientation which appears  to have nearly resolved.  Differential includes prolonged presyncopal symptoms versus vertebrobasilar TIA or complex partial seizure. Patient is not a candidate for thrombolysis at this time secondary to being on Eliquis and symptoms nearly resolving.  Clinical presentation does not seem compatible with LVO and patient has resolving and minimal deficits  and has abnormal renal function at baseline hence will not pursue CT angiogram at this time emergently  Recommendations: Admission to medical team for further evaluation of this episode.  Frequent neurochecks and vital signs as per stroke protocol.  Check MRI scan of the brain, EEG and telemetry monitoring.  Continue Eliquis for stroke prevention and maintain aggressive risk factor modification.  Dr. Shelton can do telemetry neurology follow-up as needed. Discussed with patient son and daughter and answered questions.  Discussed with Dr. Suzette ER MD      I personally spent a total of 55 minutes in the care of the patient today including getting/reviewing separately obtained history, performing a medically appropriate exam/evaluation, counseling and educating, placing orders, referring and communicating with other health care professionals, documenting clinical information in the EHR, independently interpreting results, and coordinating care.         Eather Popp, MD [facility] [phone]  If 7pm- 7am, please page neurology on call as listed in [facility].

## 2024-03-18 NOTE — ED Notes (Signed)
  Device system confirmed to be MRI conditional, with implant date > 6 weeks ago, and no evidence of abandoned or epicardial leads in review of most recent CXR  Device last cleared by EP Provider: Jodie Passey 03/18/2024   Clearance is good through for 1 year as long as parameters remain stable at time of check. If pt undergoes a cardiac device procedure during that time, they should be re-cleared.   Tachy-therapies to be programmed off if applicable with device back to pre-MRI settings after completion of exam.  [employer] - Programming recommendation received through [employer] App/Tablet  Dustin Vincent  03/18/2024 11:51 AM

## 2024-03-18 NOTE — ED Provider Notes (Signed)
 [place] EMERGENCY DEPARTMENT AT [facility] Provider Note   CSN: [id number] Arrival date & time: 03/18/24  9080  An emergency department physician performed an initial assessment on this suspected stroke patient at 88.  Patient presents with: Weakness   YOUSUF Vincent is a 89 y.o. male.   Patient has a history of hypertension coronary disease diabetes and has an IVC filter.  Dustin Vincent is on Eliquis.  According to his family Dustin Vincent was have an episode where Dustin Vincent kind of blacked out and was staring for about an hour and would not respond but slowly came around.  Dustin Vincent had some similar episodes before that did not last this long.  The history is provided by the patient and a relative. No language interpreter was used.  Altered Mental Status Presenting symptoms: behavior changes and confusion   Severity:  Severe Most recent episode:  Today Episode history:  Single Progression:  Improving Chronicity:  Recurrent Context: not alcohol use   Associated symptoms: no abdominal pain, no hallucinations, no headaches, no rash and no seizures        Prior to Admission medications   Medication Sig Start Date End Date Taking? Authorizing Provider  potassium chloride  SA (KLOR-CON  M) 20 MEQ tablet Take 20 mEq by mouth daily. 12/31/23  Yes [provider]  Semaglutide, 1 MG/DOSE, 4 MG/3ML SOPN Inject into the skin. 12/21/23  Yes [provider]  albuterol  (PROVENTIL  HFA;VENTOLIN  HFA) 108 (90 Base) MCG/ACT inhaler Inhale 2 puffs into the lungs every 6 (six) hours as needed for wheezing or shortness of breath. 08/22/15   Antoinette Doe, MD  apixaban (ELIQUIS) 5 MG TABS tablet TAKE ONE-HALF TABLET BY MOUTH EVERY 12 HOURS CAUTION BLOOD THINNER **NOTE CHANGE IN TABLET STRENGTH AND DIRECTIONS** 02/25/21   [provider]  atorvastatin  (LIPITOR) 80 MG tablet Take 40 mg by mouth at bedtime.    [provider]  azithromycin  (ZITHROMAX ) 250 MG tablet Take first 2 tablets  together, then 1 every day until finished. 09/02/23   Stuart Vernell Norris, PA-C  benzonatate  (TESSALON ) 100 MG capsule Take 1 capsule (100 mg total) by mouth every 8 (eight) hours. 06/24/23   Raspet, Erin K, PA-C  Cholecalciferol 25 MCG (1000 UT) tablet Take 1 tablet by mouth daily. 01/11/21   [provider]  cyanocobalamin 1000 MCG tablet Take 1 tablet by mouth daily. 01/11/21   [provider]  dextromethorphan-guaiFENesin  (MUCINEX  DM) 30-600 MG 12hr tablet Take 1 tablet by mouth daily.    [provider]  doxycycline  (VIBRAMYCIN ) 100 MG capsule Take 1 capsule (100 mg total) by mouth 2 (two) times daily. 06/24/23   Raspet, Erin K, PA-C  ferrous sulfate  325 (65 FE) MG tablet Take 325 mg by mouth daily with breakfast. Patient not taking: Reported on 12/25/2021    [provider]  fluticasone  (FLONASE ) 50 MCG/ACT nasal spray Place 1 spray into both nostrils daily. Patient not taking: Reported on 12/25/2021    [provider]  fluticasone  (FLONASE ) 50 MCG/ACT nasal spray Place 1 spray into both nostrils 2 (two) times daily. 01/06/23   Stuart Vernell Norris, PA-C  furosemide  (LASIX ) 40 MG tablet Take 80 mg by mouth daily.    [provider]  guaiFENesin  (MUCINEX ) 600 MG 12 hr tablet Take 1 tablet (600 mg total) by mouth 2 (two) times daily as needed. 09/02/23   Stuart Vernell Norris, PA-C  isosorbide  mononitrate (IMDUR ) 30 MG 24 hr tablet TAKE ONE TABLET BY MOUTH ONCE EVERY DAY  FOR HEART (DO NOT TAKE CIALIS, VIAGRA OR LEVITRA WHILE Dustin ARE ON THIS MEDICATION). 05/14/21   [provider]  levothyroxine (SYNTHROID) 25 MCG tablet TAKE THREE TABLETS BY MOUTH EVERY DAY FOR THYROID  03/02/21   [provider]  losartan  (COZAAR ) 100 MG tablet Take 100 mg by mouth daily.    [provider]  losartan  (COZAAR ) 50 MG tablet Take 1 tablet by mouth daily. 07/09/20   [provider]  metoprolol  succinate (TOPROL -XL) 100 MG 24 hr tablet  Take 50 mg by mouth daily. Take with or immediately following a meal.    [provider]  Multiple Vitamin (MULTIVITAMIN WITH MINERALS) TABS tablet Take 1 tablet by mouth daily. Patient not taking: Reported on 12/25/2021    [provider]  nitroGLYCERIN (NITROSTAT) 0.4 MG SL tablet DISSOLVE ONE TABLET UNDER THE TONGUE EVERY 5 MINUTES AS NEEDED FOR CHEST PAIN. REPEAT EVERY 5 MINUTES UP TO 3 DOSES. IF NO RELIEF SEEK MEDICAL HELP.(DO NOT TAKE CIALIS, VIAGRA OR LEVITRA WHILE Dustin ARE ON THIS MEDICATION ). 07/09/20   [provider]  omeprazole (PRILOSEC) 20 MG capsule TAKE ONE CAPSULE BY MOUTH TWO TIMES A DAY TO CONTROL STOMACH ACID.   TAKE 15-30 MINUTES BEFORE A MEAL 03/30/21   [provider]  oxyCODONE  (OXY IR/ROXICODONE ) 5 MG immediate release tablet Take 5 mg by mouth 2 (two) times daily as needed for severe pain. Patient not taking: Reported on 12/25/2021    [provider]  oxymetazoline  (AFRIN NASAL SPRAY) 0.05 % nasal spray Apply 2 sprays to the left nostril once daily as needed for nosebleeds.  Only use for nosebleeds 05/07/23   Stuart Vernell Norris, PA-C  polyethylene glycol powder (GLYCOLAX /MIRALAX ) 17 GM/SCOOP powder MIX 17GM (1 CAPFUL) BY MOUTH TWO TIMES A DAY MIX 17 GRAMS (USE BOTTLE CAP) IN LIQUID OF CHOICE AS DIRECTED 04/05/21   [provider]  promethazine  (PHENERGAN ) 25 MG tablet Take 12.5 mg by mouth every 6 (six) hours as needed for nausea or vomiting. Patient not taking: Reported on 12/25/2021    [provider]  saxagliptin HCl (ONGLYZA) 5 MG TABS tablet Take 5 mg by mouth daily. Patient not taking: Reported on 12/25/2021    [provider]  sennosides-docusate sodium  (SENOKOT-S) 8.6-50 MG tablet Take 1 tablet by mouth daily.    [provider]  Spacer/Aero-Holding Chambers (AEROCHAMBER PLUS) inhaler Use with inhaler 06/24/21   Van Knee, MD  spironolactone  (ALDACTONE ) 25 MG tablet Take 12.5 mg by  mouth daily.     [provider]  tamsulosin  (FLOMAX ) 0.4 MG CAPS capsule Take 0.4 mg by mouth daily after supper.    [provider]  Tiotropium Bromide-Olodaterol 2.5-2.5 MCG/ACT AERS INHALE 2 INHALATIONS BY ORAL INHALATION EVERY DAY 04/05/21   [provider]  torsemide (DEMADEX) 20 MG tablet TAKE TWO TABLETS BY MOUTH IN THE MORNING AND TAKE ONE TABLET IN THE EVENING -NOTE DOSE 04/05/21   [provider]  triamcinolone cream (KENALOG) 0.1 % Apply 1 Application topically 2 (two) times daily. 03/03/24   Leath-Warren, Etta PARAS, NP    Allergies: Amiodarone, Gabapentin , Terazosin , Tramadol, Lisinopril, and Sulfa antibiotics    Review of Systems  Constitutional:  Negative for appetite change and fatigue.  HENT:  Negative for congestion, ear discharge and sinus pressure.   Eyes:  Negative for discharge.  Respiratory:  Negative for cough.   Cardiovascular:  Negative for chest pain.  Gastrointestinal:  Negative for abdominal pain and diarrhea.  Genitourinary:  Negative for frequency and hematuria.  Musculoskeletal:  Negative for back pain.  Skin:  Negative for rash.  Neurological:  Negative for seizures and headaches.  Psychiatric/Behavioral:  Positive for confusion. Negative for hallucinations.     Updated Vital Signs BP 128/82   Pulse 70   Temp 98 F (36.7 C) (Oral)   Resp 14   Ht 5' 11 (1.803 m)   Wt 69.4 kg   SpO2 100%   BMI 21.34 kg/m   Physical Exam Vitals and nursing note reviewed.  Constitutional:      Appearance: Dustin Vincent is well-developed.  HENT:     Head: Normocephalic.     Nose: Nose normal.     Mouth/Throat:     Mouth: Mucous membranes are moist.  Eyes:     General: No scleral icterus.    Conjunctiva/sclera: Conjunctivae normal.  Neck:     Thyroid : No thyromegaly.  Cardiovascular:     Rate and Rhythm: Normal rate and regular rhythm.     Heart sounds: No murmur heard.    No friction rub. No gallop.  Pulmonary:     Breath  sounds: No stridor. No wheezing or rales.  Chest:     Chest wall: No tenderness.  Abdominal:     General: There is no distension.     Tenderness: There is no abdominal tenderness. There is no rebound.  Musculoskeletal:        General: Normal range of motion.     Cervical back: Neck supple.  Lymphadenopathy:     Cervical: No cervical adenopathy.  Skin:    Findings: No erythema or rash.  Neurological:     Mental Status: Dustin Vincent is oriented to person, place, and time.     Motor: No abnormal muscle tone.     Coordination: Coordination normal.  Psychiatric:        Behavior: Behavior normal.     (all labs ordered are listed, but only abnormal results are displayed) Labs Reviewed  PROTIME-INR - Abnormal; Notable for the following components:      Result Value   Prothrombin Time 16.4 (*)    INR 1.3 (*)    All other components within normal limits  CBC - Abnormal; Notable for the following components:   RBC 3.90 (*)    Hemoglobin 11.0 (*)    HCT 34.4 (*)    RDW 17.5 (*)    Platelets 133 (*)    All other components within normal limits  COMPREHENSIVE METABOLIC PANEL WITH GFR - Abnormal; Notable for the following components:   Glucose, Bld 142 (*)    BUN 26 (*)    Creatinine, Ser 2.15 (*)    GFR, Estimated 29 (*)    All other components within normal limits  CBG MONITORING, ED - Abnormal; Notable for the following components:   Glucose-Capillary 189 (*)    All other components within normal limits  I-STAT CHEM 8, ED - Abnormal; Notable for the following components:   BUN 31 (*)    Creatinine, Ser 2.30 (*)    Glucose, Bld 145 (*)    Hemoglobin 12.2 (*)    HCT 36.0 (*)    All other components within normal limits  APTT  DIFFERENTIAL  ETHANOL  URINE DRUG SCREEN    EKG: None  Radiology:    Procedures   Medications Ordered in the ED - No data to display  CRITICAL CARE Performed by: Fairy Sermon Total critical care time: 40 minutes Critical care time was exclusive  of separately billable procedures and treating other patients. Critical care was necessary to treat or prevent imminent or life-threatening deterioration. Critical care was time spent personally by me on the following activities: development of treatment plan with patient and/or surrogate as well as nursing, discussions with consultants, evaluation of patient's response to treatment, examination of patient, obtaining history from patient or surrogate, ordering and performing treatments and interventions, ordering and review of laboratory studies, ordering and review of radiographic studies, pulse oximetry and re-evaluation of patient's condition. Code stroke was called.  The neurologist did not feel like the patient should get thrombolytics.  Dustin Vincent will be admitted by medicine and get an EEG and MRI of the brain                               Medical Decision Making Amount and/or Complexity of Data Reviewed Labs: ordered. Radiology: ordered.  Risk Decision regarding hospitalization.   Altered mental status with staring spell and confusion.  Patient admitted to medicine with neuro consult     Final diagnoses:  None    ED Discharge Orders     None          Suzette Pac, MD 03/18/24 1431

## 2024-03-18 NOTE — ED Notes (Signed)
 Back from MRI. Called to [facility] to assess heart rhythm. Pt NSR. Essential tremor creates VT-like artifact. Skin pink, W&D. Denies CP or sob. Returns to NSR when arms held still. Will continue to monitor.

## 2024-03-19 ENCOUNTER — Observation Stay (HOSPITAL_COMMUNITY)
Admit: 2024-03-19 | Discharge: 2024-03-19 | Disposition: A | Discharge status: Home / Self Care | Attending: Internal Medicine | Admitting: Internal Medicine

## 2024-03-19 ENCOUNTER — Observation Stay (HOSPITAL_COMMUNITY)

## 2024-03-19 DIAGNOSIS — R55 Syncope and collapse: Secondary | ICD-10-CM

## 2024-03-19 DIAGNOSIS — R4182 Altered mental status, unspecified: Secondary | ICD-10-CM | POA: Diagnosis not present

## 2024-03-19 DIAGNOSIS — R569 Unspecified convulsions: Secondary | ICD-10-CM | POA: Diagnosis not present

## 2024-03-19 LAB — GLUCOSE, CAPILLARY
Glucose-Capillary: 123 mg/dL — ABNORMAL HIGH (ref 70–99)
Glucose-Capillary: 188 mg/dL — ABNORMAL HIGH (ref 70–99)
Glucose-Capillary: 146 mg/dL — ABNORMAL HIGH (ref 70–99)

## 2024-03-19 LAB — COMPREHENSIVE METABOLIC PANEL WITH GFR
ALT: 11 U/L (ref 0–44)
AST: 14 U/L — ABNORMAL LOW (ref 15–41)
Albumin: 3.8 g/dL (ref 3.5–5.0)
Alkaline Phosphatase: 62 U/L (ref 38–126)
Anion gap: 10 (ref 5–15)
BUN: 24 mg/dL — ABNORMAL HIGH (ref 8–23)
CO2: 26 mmol/L (ref 22–32)
Calcium: 8.7 mg/dL — ABNORMAL LOW (ref 8.9–10.3)
Chloride: 102 mmol/L (ref 98–111)
Creatinine, Ser: 1.98 mg/dL — ABNORMAL HIGH (ref 0.61–1.24)
GFR, Estimated: 32 mL/min — ABNORMAL LOW (ref >60)
Glucose, Bld: 111 mg/dL — ABNORMAL HIGH (ref 70–99)
Potassium: 4 mmol/L (ref 3.5–5.1)
Sodium: 138 mmol/L (ref 135–145)
Total Bilirubin: 1.3 mg/dL — ABNORMAL HIGH (ref 0.0–1.2)
Total Protein: 6.1 g/dL — ABNORMAL LOW (ref 6.5–8.1)

## 2024-03-19 LAB — ECHOCARDIOGRAM COMPLETE
AR max vel: 1.31 cm2
AV Area VTI: 1.32 cm2
AV Area mean vel: 1.42 cm2
AV Mean grad: 6 mmHg
AV Peak grad: 11.6 mmHg
Ao pk vel: 1.7 m/s
Area-P 1/2: 3.99 cm2
Height: 71 in
MV M vel: 4.48 m/s
MV Peak grad: 80.3 mmHg
P 1/2 time: 567 ms
Radius: 0.4 cm
S' Lateral: 4.5 cm
Weight: 2459.2 [oz_av]

## 2024-03-19 LAB — MAGNESIUM: Magnesium: 2.4 mg/dL (ref 1.7–2.4)

## 2024-03-19 LAB — PHOSPHORUS: Phosphorus: 3.7 mg/dL (ref 2.5–4.6)

## 2024-03-19 MED ORDER — PERFLUTREN LIPID MICROSPHERE
1.0000 mL | INTRAVENOUS | Status: AC | PRN
  Administered 2024-03-19: 3 mL via INTRAVENOUS

## 2024-03-19 NOTE — Progress Notes (Signed)
 IV discontinued, site WNL. Discharge instructions reviewed with patient and son. Verbalized understanding.

## 2024-03-19 NOTE — Progress Notes (Signed)
 [facility] notification completed. Notification ID: [id number].   03/19/24 9161  TOC Brief Assessment  Insurance and Status Reviewed  Patient has primary care physician Yes  Home environment has been reviewed Lives with step-daughter.  Prior level of function: Fairly independent.  Prior/Current Home Services No current home services  Social Drivers of Health Review SDOH reviewed no interventions necessary  Readmission risk has been reviewed Yes  Transition of care needs no transition of care needs at this time

## 2024-03-19 NOTE — Progress Notes (Signed)
-----------------------------------------------------------  [facility]--------------------------------------------------- D(Data) A(Action) R(response)     Data: Noted open order for 2 D ECHO prior to planned discharge 03/20/2024    Action: Called ECHO lab and left message to ensure pt. Receives ECHO today.    Response: none     Dewel Lotter, RN [employer]

## 2024-03-19 NOTE — Procedures (Signed)
 Patient Name: Dustin Vincent  MRN: [id number]  Epilepsy Attending: Arlin MALVA Krebs  Referring Physician/Provider: Evonnie Lenis, MD  Date: 03/19/2024  Duration: 22.58 mins  Patient history: 89yo M with ams. EEG to evaluate for seizure  Level of alertness: Awake  AEDs during EEG study: None  Technical aspects: This EEG study was done with scalp electrodes positioned according to the 10-20 International system of electrode placement. Electrical activity was reviewed with band pass filter of 1-70Hz , sensitivity of 7 uV/mm, display speed of 80mm/sec with a 60Hz  notched filter applied as appropriate. EEG data were recorded continuously and digitally stored.  Video monitoring was available and reviewed as appropriate.  Description: The posterior dominant rhythm consists of 8 Hz activity of moderate voltage (25-35 uV) seen predominantly in posterior head regions, symmetric and reactive to eye opening and eye closing. Hyperventilation and photic stimulation were not performed.      IMPRESSION: This study is within normal limits. No seizures or epileptiform discharges were seen throughout the recording.  A normal interictal EEG does not exclude the diagnosis of epilepsy.   Nayelly Laughman O Vaughn Frieze

## 2024-03-19 NOTE — Progress Notes (Signed)
 EEG complete, results are pending.

## 2024-03-19 NOTE — Discharge Summary (Signed)
 Physician Discharge Summary  Dustin Vincent FMW:[id number] DOB: 1932/05/16 DOA: 03/18/2024  [facility]  Admit date: 03/18/2024  Discharge date: 03/19/2024  Admitted From:Home  Disposition:  Home  Recommendations for Outpatient Follow-up:  Follow up with PCP in 1-2 weeks Follow-up with neurology with referral sent to further evaluate for possible seizures versus complex migraine versus other causes Recommend patient not to drive until further evaluation by neurology. Continue on home medications as per  Home Health: None  Equipment/Devices: None  Discharge Condition:Stable  CODE STATUS: Full  Diet recommendation: Heart Healthy/carb modified  Brief/Interim Summary: Dustin Vincent is a 89 year old male with a history of ischemic cardiomyopathy with EF 30% (04/2023), coronary disease with history of CABG x 3 (2008), COPD, hypertension, diabetes mellitus type 2, hyperlipidemia, GERD, BPH, Mobitz type II AV block status post ablation and ICD placement, persistent atrial fibrillation presenting with transient episode of altered mentation and lack of awareness. The patient is a difficult historian.  History is supplemented by the patient's family. Per the patient's family, the patient has about 2-3 episodes per week with each episode lasting less than 5 minutes. The episodes are characterized by the patient usually staring off into space, acting confused, and not answering questions.  Patient was admitted for transient mental status changes with possible etiology to include orthostasis versus seizure activity versus vertebrobasilar insufficiency versus dysrhythmia versus medication effect versus complex migraine.  He has undergone MRI with no acute findings noted and 2D echocardiogram is at his usual baseline.  EEG with no acute findings noted and patient has remained at baseline throughout the remainder of his stay.  He is otherwise in stable condition for discharge  and will need to follow-up with neurology outpatient for further evaluation given the frequency of his episodes.  They overall appear quite benign however.  He has been recommended not to drive until further evaluation.  Discharge Diagnoses:  Principal Problem:   Seizure-like activity (HCC) Active Problems:   Thrombocytopenia   Chronic HFrEF (heart failure with reduced ejection fraction) (HCC)   Persistent atrial fibrillation (HCC)  Principal discharge diagnosis: Transient alterations in mental status.  Discharge Instructions  Discharge Instructions     Ambulatory referral to Neurology   Complete by: As directed    An appointment is requested in approximately: 8 weeks   Diet - low sodium heart healthy   Complete by: As directed    Increase activity slowly   Complete by: As directed       Allergies as of 03/19/2024       Reactions   Amiodarone Other (See Comments)   Hypothyroidism   Gabapentin  Other (See Comments)   Unknown    Terazosin  Other (See Comments)   Dizziness   Tramadol Nausea And Vomiting   Lisinopril Cough   Sulfa Antibiotics Rash   Was salve        Medication List     TAKE these medications    AeroChamber Plus inhaler Use with inhaler   albuterol  108 (90 Base) MCG/ACT inhaler Commonly known as: VENTOLIN  HFA Inhale 2 puffs into the lungs every 6 (six) hours as needed for wheezing or shortness of breath.   apixaban 5 MG Tabs tablet Commonly known as: ELIQUIS Take 2.5 mg by mouth 2 (two) times daily.   atorvastatin  80 MG tablet Commonly known as: LIPITOR Take 40 mg by mouth at bedtime.   benzonatate  200 MG capsule Commonly known as: TESSALON  Take 200 mg by mouth 2 (two) times daily as  needed for cough.   Cholecalciferol 25 MCG (1000 UT) tablet Take 1 tablet by mouth daily.   cyanocobalamin 1000 MCG tablet Take 1 tablet by mouth daily.   empagliflozin 25 MG Tabs tablet Commonly known as: JARDIANCE Take 12.5 mg by mouth daily.    famotidine 20 MG tablet Commonly known as: PEPCID Take 20 mg by mouth at bedtime.   fluticasone  50 MCG/ACT nasal spray Commonly known as: FLONASE  Place 1 spray into both nostrils 2 (two) times daily. What changed:  when to take this reasons to take this   losartan  25 MG tablet Commonly known as: COZAAR  Take 25 mg by mouth daily.   metoprolol  succinate 25 MG 24 hr tablet Commonly known as: TOPROL -XL Take 12.5 mg by mouth daily. Take with or immediately following a meal.   nitroGLYCERIN 0.4 MG SL tablet Commonly known as: NITROSTAT Place 0.4 mg under the tongue every 5 (five) minutes as needed for chest pain.   oxymetazoline  0.05 % nasal spray Commonly known as: Afrin Nasal Spray Apply 2 sprays to the left nostril once daily as needed for nosebleeds.  Only use for nosebleeds What changed:  how much to take how to take this when to take this reasons to take this   polyethylene glycol powder 17 GM/SCOOP powder Commonly known as: GLYCOLAX /MIRALAX  Take 17 g by mouth 2 (two) times daily as needed for moderate constipation.   potassium chloride  SA 20 MEQ tablet Commonly known as: KLOR-CON  M Take 20 mEq by mouth daily.   Semaglutide (1 MG/DOSE) 4 MG/3ML Sopn Inject 1 mg into the skin every Thursday.   Synthroid 25 MCG tablet Generic drug: levothyroxine Take 75 mcg by mouth daily before breakfast.   tamsulosin  0.4 MG Caps capsule Commonly known as: FLOMAX  Take 0.8 mg by mouth daily after supper.   torsemide 20 MG tablet Commonly known as: DEMADEX Take 20-40 mg by mouth 2 (two) times daily.        Follow-up Information     [facility] Go in 1 week(s).   Contact information: [address] [place] [address] [phone]         [facility]. Go to.   Specialty: Neurology Contact information: [address] [place]  [address] [phone]               Allergies  Allergen  Reactions   Amiodarone Other (See Comments)    Hypothyroidism   Gabapentin  Other (See Comments)    Unknown    Terazosin  Other (See Comments)    Dizziness   Tramadol Nausea And Vomiting   Lisinopril Cough   Sulfa Antibiotics Rash    Was salve    Consultations: None    Discharge Exam: Vitals:   03/19/24 1123 03/19/24 1257  BP: 109/72 109/85  Pulse: (!) 103   Resp:  16  Temp:    SpO2:  100%   Vitals:   03/18/24 2151 03/19/24 0003 03/19/24 1123 03/19/24 1257  BP: 97/64 109/73 109/72 109/85  Pulse: 70 70 (!) 103   Resp:  17  16  Temp: 98.8 F (37.1 C) 98.6 F (37 C)    TempSrc: Oral Oral    SpO2: 96% 98%  100%  Weight:      Height:        General: Pt is alert, awake, not in acute distress Cardiovascular: RRR, S1/S2 +, no rubs, no gallops Respiratory: CTA bilaterally, no wheezing, no rhonchi Abdominal: Soft, NT, ND, bowel sounds +  Extremities: no edema, no cyanosis    The results of significant diagnostics from this hospitalization (including imaging, microbiology, ancillary and laboratory) are listed below for reference.     Microbiology: No results found for this or any previous visit (from the past 240 hours).   Labs: BNP (last 3 results) No results for input(s): BNP in the last 8760 hours. Basic Metabolic Panel: Recent Labs  Lab 03/18/24 1032 03/18/24 1045 03/19/24 0408  NA 139 140 138  K 3.7 4.1 4.0  CL 101 101 102  CO2 28  --  26  GLUCOSE 142* 145* 111*  BUN 26* 31* 24*  CREATININE 2.15* 2.30* 1.98*  CALCIUM  9.1  --  8.7*  MG  --   --  2.4  PHOS  --   --  3.7   Liver Function Tests: Recent Labs  Lab 03/18/24 1032 03/19/24 0408  AST 17 14*  ALT 13 11  ALKPHOS 71 62  BILITOT 1.2 1.3*  PROT 7.0 6.1*  ALBUMIN 4.3 3.8   No results for input(s): LIPASE, AMYLASE in the last 168 hours. No results for input(s): AMMONIA in the last 168 hours. CBC: Recent Labs  Lab 03/18/24 1032 03/18/24 1045  WBC 5.1  --   NEUTROABS 3.7  --    HGB 11.0* 12.2*  HCT 34.4* 36.0*  MCV 88.2  --   PLT 133*  --    Cardiac Enzymes: No results for input(s): CKTOTAL, CKMB, CKMBINDEX, TROPONINI in the last 168 hours. BNP: Invalid input(s): POCBNP CBG: Recent Labs  Lab 03/18/24 1614 03/18/24 2137 03/19/24 0715 03/19/24 1103 03/19/24 1609  GLUCAP 154* 158* 123* 188* 146*   D-Dimer No results for input(s): DDIMER in the last 72 hours. Hgb A1c Recent Labs    03/18/24 1032  HGBA1C 7.1*   Lipid Profile No results for input(s): CHOL, HDL, LDLCALC, TRIG, CHOLHDL, LDLDIRECT in the last 72 hours. Thyroid  function studies Recent Labs    03/18/24 1032  TSH 2.560   Anemia work up Recent Labs    03/18/24 1029 03/18/24 1032  VITAMINB12  --  1,336*  FOLATE 7.5  --    Urinalysis    Component Value Date/Time   COLORURINE YELLOW 03/18/2024 1149   APPEARANCEUR HAZY (A) 03/18/2024 1149   LABSPEC 1.009 03/18/2024 1149   PHURINE 7.0 03/18/2024 1149   GLUCOSEU >=500 (A) 03/18/2024 1149   HGBUR NEGATIVE 03/18/2024 1149   BILIRUBINUR NEGATIVE 03/18/2024 1149   BILIRUBINUR negative 12/25/2021 0936   KETONESUR NEGATIVE 03/18/2024 1149   PROTEINUR NEGATIVE 03/18/2024 1149   UROBILINOGEN 1.0 12/25/2021 0936   UROBILINOGEN 0.2 06/17/2011 0151   NITRITE NEGATIVE 03/18/2024 1149   LEUKOCYTESUR MODERATE (A) 03/18/2024 1149   Sepsis Labs Recent Labs  Lab 03/18/24 1032  WBC 5.1   Microbiology No results found for this or any previous visit (from the past 240 hours).   Time coordinating discharge: 35 minutes  SIGNED:   Adron JONETTA Fairly, DO [facility] 03/19/2024, 5:11 PM  If 7PM-7AM, please contact night-coverage [email or web address]
